# Patient Record
Sex: Female | Born: 1990 | Hispanic: Yes | Marital: Single | Smoking: Former smoker
Health system: Southern US, Community
[De-identification: ages and names within clinical notes are randomized; demographics above are authoritative.]

## PROBLEM LIST (undated history)

## (undated) ENCOUNTER — Inpatient Hospital Stay (HOSPITAL_COMMUNITY): Payer: Self-pay

## (undated) DIAGNOSIS — M797 Fibromyalgia: Secondary | ICD-10-CM

## (undated) DIAGNOSIS — J42 Unspecified chronic bronchitis: Secondary | ICD-10-CM

## (undated) DIAGNOSIS — G43909 Migraine, unspecified, not intractable, without status migrainosus: Secondary | ICD-10-CM

## (undated) DIAGNOSIS — M549 Dorsalgia, unspecified: Secondary | ICD-10-CM

## (undated) DIAGNOSIS — S022XXA Fracture of nasal bones, initial encounter for closed fracture: Secondary | ICD-10-CM

## (undated) DIAGNOSIS — IMO0002 Reserved for concepts with insufficient information to code with codable children: Secondary | ICD-10-CM

## (undated) DIAGNOSIS — F329 Major depressive disorder, single episode, unspecified: Secondary | ICD-10-CM

## (undated) DIAGNOSIS — S0232XA Fracture of orbital floor, left side, initial encounter for closed fracture: Secondary | ICD-10-CM

## (undated) DIAGNOSIS — M069 Rheumatoid arthritis, unspecified: Secondary | ICD-10-CM

## (undated) DIAGNOSIS — F32A Depression, unspecified: Secondary | ICD-10-CM

## (undated) DIAGNOSIS — J45909 Unspecified asthma, uncomplicated: Secondary | ICD-10-CM

## (undated) DIAGNOSIS — M329 Systemic lupus erythematosus, unspecified: Secondary | ICD-10-CM

## (undated) DIAGNOSIS — G8929 Other chronic pain: Secondary | ICD-10-CM

## (undated) DIAGNOSIS — F419 Anxiety disorder, unspecified: Secondary | ICD-10-CM

## (undated) HISTORY — DX: Fracture of nasal bones, initial encounter for closed fracture: S02.2XXA

## (undated) HISTORY — PX: WISDOM TOOTH EXTRACTION: SHX21

## (undated) HISTORY — DX: Fracture of orbital floor, left side, initial encounter for closed fracture: S02.32XA

---
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
## 2012-03-19 ENCOUNTER — Emergency Department (INDEPENDENT_AMBULATORY_CARE_PROVIDER_SITE_OTHER)
Admission: EM | Admit: 2012-03-19 | Discharge: 2012-03-19 | Disposition: A | Discharge status: Home / Self Care | Payer: Self-pay | Source: Home / Self Care | Attending: Emergency Medicine | Admitting: Emergency Medicine

## 2012-03-19 ENCOUNTER — Encounter (HOSPITAL_COMMUNITY): Payer: Self-pay | Admitting: Emergency Medicine

## 2012-03-19 DIAGNOSIS — R197 Diarrhea, unspecified: Secondary | ICD-10-CM

## 2012-03-19 DIAGNOSIS — R110 Nausea: Secondary | ICD-10-CM

## 2012-03-19 HISTORY — DX: Reserved for concepts with insufficient information to code with codable children: IMO0002

## 2012-03-19 HISTORY — DX: Systemic lupus erythematosus, unspecified: M32.9

## 2012-03-19 LAB — POCT PREGNANCY, URINE: Preg Test, Ur: NEGATIVE

## 2012-03-19 LAB — POCT URINALYSIS DIP (DEVICE)
Bilirubin Urine: NEGATIVE
Ketones, ur: NEGATIVE mg/dL
pH: 6 (ref 5.0–8.0)

## 2012-03-19 MED ORDER — ONDANSETRON HCL 4 MG PO TABS: 4.0000 mg | ORAL_TABLET | Freq: Three times a day (TID) | ORAL | Status: AC | PRN

## 2012-03-19 MED ORDER — ONDANSETRON 4 MG PO TBDP
4.0000 mg | ORAL_TABLET | Freq: Once | ORAL | Status: AC
  Administered 2012-03-19: 4 mg via ORAL

## 2012-03-19 MED ORDER — ONDANSETRON 4 MG PO TBDP
ORAL_TABLET | ORAL | Status: AC
  Filled 2012-03-19: qty 1

## 2012-03-19 MED ORDER — DIPHENOXYLATE-ATROPINE 2.5-0.025 MG PO TABS: 1.0000 | ORAL_TABLET | Freq: Four times a day (QID) | ORAL | Status: AC | PRN

## 2012-03-19 NOTE — ED Notes (Signed)
Reports stomach and back pain onset yesterday evening.  Reports nausea, no vomiting.  Reports diarrhea.  Reports 6-7 episodes of diarrhea.  Reports having to run to toilet right after eating.

## 2012-03-19 NOTE — ED Provider Notes (Addendum)
History     CSN: [id number]  Arrival date & time 03/19/12  1708   First MD Initiated Contact with Patient 03/19/12 1717      Chief Complaint  Patient presents with  . Abdominal Pain    (Consider location/radiation/quality/duration/timing/severity/associated sxs/prior treatment) HPI Comments: Patient presents urgent care this evening complaining of diarrheas and cramping pains since this morning. Has had about 6-7 liquid stools. Nonbloody. Denies any fevers and some stomach pains ( points to periumbilical region). No fevers or vomiting. Denies any urinary symptoms such as pressure, burning or vaginal discharge or bleeding. Patient couldn't go to work today as she was feeling bad requesting a doctor's note.  Patient is a 21 y.o. female presenting with abdominal pain. The history is provided by the patient.  Abdominal Pain The primary symptoms of the illness include abdominal pain, nausea and diarrhea. The primary symptoms of the illness do not include fever, fatigue, vomiting, hematemesis, hematochezia, dysuria or vaginal discharge. The current episode started 13 to 24 hours ago. The onset of the illness was sudden. The problem has not changed since onset. Symptoms associated with the illness do not include chills, diaphoresis or frequency.    Past Medical History  Diagnosis Date  . Pleurisy 01/07/11    complication after c-section w/infection  . Arthritis   . Lupus     Past Surgical History  Procedure Date  . Cesarean section 01/07/11    No family history on file.  History  Substance Use Topics  . Smoking status: Current Everyday Smoker  . Smokeless tobacco: Not on file  . Alcohol Use: No    OB History    Grav Para Term Preterm Abortions TAB SAB Ect Mult Living   1               Review of Systems  Constitutional: Negative for fever, chills, diaphoresis, activity change, appetite change and fatigue.  Respiratory: Negative for wheezing.   Gastrointestinal: Positive  for nausea, abdominal pain and diarrhea. Negative for vomiting, hematochezia and hematemesis.  Genitourinary: Negative for dysuria, frequency, flank pain, vaginal discharge and genital sores.  Musculoskeletal: Negative for joint swelling and arthralgias.  Neurological: Negative for dizziness.    Allergies  Latex; Penicillins; and Vicodin  Home Medications   Current Outpatient Rx  Name Route Sig Dispense Refill  . DIPHENOXYLATE-ATROPINE 2.5-0.025 MG PO TABS Oral Take 1 tablet by mouth 4 (four) times daily as needed for diarrhea or loose stools. 15 tablet 0  . ONDANSETRON HCL 4 MG PO TABS Oral Take 1 tablet (4 mg total) by mouth every 8 (eight) hours as needed for nausea. 20 tablet 0    BP 126/61  Pulse 67  Temp 98.4 F (36.9 C) (Oral)  Resp 16  SpO2 100%  Physical Exam  Nursing note and vitals reviewed. Constitutional: Vital signs are normal. She appears well-developed and well-nourished.  Non-toxic appearance. She does not have a sickly appearance. She does not appear ill. No distress.  HENT:  Head: Normocephalic.  Mouth/Throat: Uvula is midline and oropharynx is clear and moist. No oropharyngeal exudate.  Eyes: Conjunctivae are normal.  Neck: Neck supple.  Pulmonary/Chest: Effort normal and breath sounds normal.  Abdominal: Soft. She exhibits no shifting dullness and no distension. There is tenderness. There is no rigidity, no rebound, no guarding and negative Murphy's sign.    Skin: No erythema.    ED Course  Procedures (including critical care time)  Labs Reviewed  POCT URINALYSIS DIP (DEVICE) - Abnormal; Notable  for the following:    Hgb urine dipstick TRACE (*)     Leukocytes, UA SMALL (*)  Biochemical Testing Only. Please order routine urinalysis from main lab if confirmatory testing is needed.   All other components within normal limits  POCT PREGNANCY, URINE   No results found.   1. Diarrhea   2. Nausea       MDM   Patient looks comfortable mildly  nauseous. Soft with minimal periumbilical tenderness on abdominal exam. Suspect patient might be experiencing a viral gastroenteritis process. We have discussed symptomatic management and dietary changes for the next 72 hours. Have also discussed what symptoms should warrant further evaluation all specifically if vomiting fevers or right lower quadrant pain. Patient understands and agrees with treatment plan and followup care as necessary.       Jimmie Molly, MD 03/19/12 1816  Jimmie Molly, MD 03/19/12 [phone]

## 2012-05-03 ENCOUNTER — Encounter (HOSPITAL_COMMUNITY): Payer: Self-pay | Admitting: Emergency Medicine

## 2012-05-03 ENCOUNTER — Emergency Department (HOSPITAL_COMMUNITY)
Admission: EM | Admit: 2012-05-03 | Discharge: 2012-05-03 | Disposition: A | Discharge status: Home / Self Care | Payer: Self-pay | Attending: Emergency Medicine | Admitting: Emergency Medicine

## 2012-05-03 DIAGNOSIS — M329 Systemic lupus erythematosus, unspecified: Secondary | ICD-10-CM | POA: Insufficient documentation

## 2012-05-03 DIAGNOSIS — M25531 Pain in right wrist: Secondary | ICD-10-CM

## 2012-05-03 DIAGNOSIS — M25532 Pain in left wrist: Secondary | ICD-10-CM

## 2012-05-03 DIAGNOSIS — M129 Arthropathy, unspecified: Secondary | ICD-10-CM | POA: Insufficient documentation

## 2012-05-03 DIAGNOSIS — M069 Rheumatoid arthritis, unspecified: Secondary | ICD-10-CM | POA: Insufficient documentation

## 2012-05-03 DIAGNOSIS — M25539 Pain in unspecified wrist: Secondary | ICD-10-CM | POA: Insufficient documentation

## 2012-05-03 HISTORY — DX: Rheumatoid arthritis, unspecified: M06.9

## 2012-05-03 LAB — BASIC METABOLIC PANEL WITH GFR
BUN: 8 mg/dL (ref 6–23)
Calcium: 9.4 mg/dL (ref 8.4–10.5)
GFR calc non Af Amer: >90 mL/min (ref >90)
Glucose, Bld: 96 mg/dL (ref 70–99)

## 2012-05-03 LAB — BASIC METABOLIC PANEL
CO2: 27 mEq/L (ref 19–32)
Chloride: 104 mEq/L (ref 96–112)
Creatinine, Ser: 0.6 mg/dL (ref 0.50–1.10)
GFR calc Af Amer: >90 mL/min (ref >90)
Potassium: 4.1 mEq/L (ref 3.5–5.1)
Sodium: 137 mEq/L (ref 135–145)

## 2012-05-03 MED ORDER — PREDNISONE 20 MG PO TABS: 40.0000 mg | ORAL_TABLET | Freq: Every day | ORAL | Status: DC

## 2012-05-03 MED ORDER — PREDNISONE 20 MG PO TABS
40.0000 mg | ORAL_TABLET | Freq: Once | ORAL | Status: AC
  Administered 2012-05-03: 40 mg via ORAL
  Filled 2012-05-03: qty 2

## 2012-05-03 NOTE — ED Notes (Signed)
Pt c/o muscle spasms to right arm/wrist and left knuckle area. Symptoms onset Saturday. Pt reports history of lupus and arthritis but currently symptoms not the same.

## 2012-05-03 NOTE — Progress Notes (Signed)
Orthopedic Tech Progress Note Patient Details:  [name] 1991/01/19 [id number]  Ortho Devices Type of Ortho Device: Velcro wrist splint Ortho Device/Splint Location: bilateral Ortho Device/Splint Interventions: Application   Omeka Holben 05/03/2012, 2:55 PM

## 2012-05-03 NOTE — ED Provider Notes (Signed)
History  This chart was scribed for [name]. Oletta Lamas, MD by Ladona Ridgel Day. This patient was seen in room TR09C/TR09C and the patient's care was started at 1246.   CSN: [id number]  Arrival date & time 05/03/12  1246   None     Chief Complaint  Patient presents with  . Spasms  = The history is provided by the patient. No language interpreter was used.   [name] is a 21 y.o. female who presents to the Emergency Department complaining of bilateral hand/wrist pain for the past 3 days with some associated numbness/tingling of her wrists. 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 She states that any activities like driving maker her wrist/hand pain worse. She denies diet changes, emesis, or diarrhea.  She gets sharp paresthesias through hands, starting at wrists, radiates to forearms.  No recent lupus or RA flares.  Not on chronic prednisone.  Has been on it before.  No weakness to arms, legs.  No HA's.  No trauma.    Past Medical History  Diagnosis Date  . Pleurisy 01/07/11    complication after c-section w/infection  . Arthritis   . Lupus   . Rheumatoid arthritis     Past Surgical History  Procedure Date  . Cesarean section 01/07/11    History reviewed. No pertinent family history.  History  Substance Use Topics  . Smoking status: Current Every Day Smoker  . Smokeless tobacco: Not on file  . Alcohol Use: No    OB History    Grav Para Term Preterm Abortions TAB SAB Ect Mult Living   1               Review of Systems  Constitutional: Negative for fever and chills.  Respiratory: Negative for shortness of breath.   Gastrointestinal: Negative for nausea, vomiting and diarrhea.  Musculoskeletal: Positive for joint swelling and arthralgias.       Bilateral wrist/hand pain  Skin: Negative for rash and wound.  Neurological: Positive for numbness (numbness/tingling of her bilateral wrists/hands). Negative for weakness.    Allergies   Latex; Penicillins; and Vicodin  Home Medications   Current Outpatient Rx  Name Route Sig Dispense Refill  . ETONOGESTREL 68 MG [place] IMPL Subcutaneous Inject 68 mg into the skin once.    Marland Kitchen PREDNISONE 20 MG PO TABS Oral Take 2 tablets (40 mg total) by mouth daily. 14 tablet 0    Triage Vitals: BP 123/60  Pulse 62  Temp 98.4 F (36.9 C) (Oral)  Resp 18  SpO2 100%  LMP 04/19/2012  Physical Exam  Nursing note and vitals reviewed. Constitutional: She is oriented to person, place, and time. She appears well-developed and well-nourished. No distress.  HENT:  Head: Normocephalic and atraumatic.  Eyes: EOM are normal. Pupils are equal, round, and reactive to light. No scleral icterus.  Neck: Neck supple. No tracheal deviation present.  Cardiovascular: Normal rate.   Pulmonary/Chest: Effort normal. No respiratory distress.  Musculoskeletal: Normal range of motion. She exhibits tenderness.       Bilateral wrist pain/tenderness with movement. No visual deformities. No malar rash, swelling, or edema.   Neurological: She is alert and oriented to person, place, and time. She has normal strength. She exhibits normal muscle tone.  Skin: Skin is warm and dry.  Psychiatric: She has a normal mood and affect. Her behavior is normal.    ED Course  Procedures (including critical care time)  DIAGNOSTIC STUDIES: Oxygen Saturation is 100% on room air, normal by my interpretation.    COORDINATION OF CARE: At 205 PM Discussed treatment plan with patient which includes prednisone. Patient agrees.    Labs Reviewed  BASIC METABOLIC PANEL   No results found.   1. Bilateral wrist pain       MDM  I personally performed the services described in this documentation, which was scribed in my presence. The recorded information has been reviewed and considered.    History suggests overuse type pain and injury.  Not classic distribution for carpal tunnel, but possible given her line of work.  I  don't think Lupus is related, but will put on prednisone as it can act as both anti-inflammatory and can help with Lupus if any invovement.  B wrist braces provided.            [name]. Rosalin Buster, MD 05/04/12 1359

## 2013-08-09 ENCOUNTER — Emergency Department (HOSPITAL_COMMUNITY)
Admission: EM | Admit: 2013-08-09 | Discharge: 2013-08-09 | Disposition: A | Discharge status: Home / Self Care | Payer: Medicaid Other | Attending: Emergency Medicine | Admitting: Emergency Medicine

## 2013-08-09 ENCOUNTER — Encounter (HOSPITAL_COMMUNITY): Payer: Self-pay | Admitting: Emergency Medicine

## 2013-08-09 DIAGNOSIS — F17200 Nicotine dependence, unspecified, uncomplicated: Secondary | ICD-10-CM | POA: Insufficient documentation

## 2013-08-09 DIAGNOSIS — Z880 Allergy status to penicillin: Secondary | ICD-10-CM | POA: Insufficient documentation

## 2013-08-09 DIAGNOSIS — R112 Nausea with vomiting, unspecified: Secondary | ICD-10-CM | POA: Insufficient documentation

## 2013-08-09 DIAGNOSIS — N390 Urinary tract infection, site not specified: Secondary | ICD-10-CM | POA: Insufficient documentation

## 2013-08-09 DIAGNOSIS — J029 Acute pharyngitis, unspecified: Secondary | ICD-10-CM | POA: Insufficient documentation

## 2013-08-09 DIAGNOSIS — IMO0001 Reserved for inherently not codable concepts without codable children: Secondary | ICD-10-CM | POA: Insufficient documentation

## 2013-08-09 DIAGNOSIS — Z3202 Encounter for pregnancy test, result negative: Secondary | ICD-10-CM | POA: Insufficient documentation

## 2013-08-09 DIAGNOSIS — Z8739 Personal history of other diseases of the musculoskeletal system and connective tissue: Secondary | ICD-10-CM | POA: Insufficient documentation

## 2013-08-09 DIAGNOSIS — Z91040 Latex allergy status: Secondary | ICD-10-CM | POA: Insufficient documentation

## 2013-08-09 LAB — CBC WITH DIFFERENTIAL/PLATELET
Basophils Absolute: 0 10*3/uL (ref 0.0–0.1)
HCT: 37.6 % (ref 36.0–46.0)
Lymphocytes Relative: 28 % (ref 12–46)
Lymphs Abs: 1.8 10*3/uL (ref 0.7–4.0)
Monocytes Absolute: 0.6 10*3/uL (ref 0.1–1.0)
Neutro Abs: 4 10*3/uL (ref 1.7–7.7)
RBC: 4.04 MIL/uL (ref 3.87–5.11)
RDW: 13 % (ref 11.5–15.5)
WBC: 6.5 10*3/uL (ref 4.0–10.5)

## 2013-08-09 LAB — BASIC METABOLIC PANEL
CO2: 23 mEq/L (ref 19–32)
Chloride: 102 mEq/L (ref 96–112)
Glucose, Bld: 97 mg/dL (ref 70–99)
Sodium: 139 mEq/L (ref 137–147)

## 2013-08-09 LAB — URINALYSIS, ROUTINE W REFLEX MICROSCOPIC
Nitrite: POSITIVE — AB
Protein, ur: NEGATIVE mg/dL
Specific Gravity, Urine: 1.018 (ref 1.005–1.030)
Urobilinogen, UA: 1 mg/dL (ref 0.0–1.0)

## 2013-08-09 LAB — PREGNANCY, URINE: Preg Test, Ur: NEGATIVE

## 2013-08-09 LAB — URINE MICROSCOPIC-ADD ON

## 2013-08-09 LAB — INFLUENZA PANEL BY PCR (TYPE A & B)
Influenza A By PCR: NEGATIVE
Influenza B By PCR: NEGATIVE

## 2013-08-09 MED ORDER — ONDANSETRON HCL 4 MG/2ML IJ SOLN
4.0000 mg | Freq: Once | INTRAMUSCULAR | Status: AC
  Administered 2013-08-09: 4 mg via INTRAVENOUS
  Filled 2013-08-09: qty 2

## 2013-08-09 MED ORDER — TRAMADOL HCL 50 MG PO TABS: 50.0000 mg | ORAL_TABLET | Freq: Four times a day (QID) | ORAL | Status: DC | PRN

## 2013-08-09 MED ORDER — ONDANSETRON 4 MG PO TBDP: 4.0000 mg | ORAL_TABLET | Freq: Three times a day (TID) | ORAL | Status: DC | PRN

## 2013-08-09 MED ORDER — CIPROFLOXACIN IN D5W 400 MG/200ML IV SOLN
400.0000 mg | Freq: Once | INTRAVENOUS | Status: AC
  Administered 2013-08-09: 400 mg via INTRAVENOUS
  Filled 2013-08-09: qty 200

## 2013-08-09 MED ORDER — KETOROLAC TROMETHAMINE 30 MG/ML IJ SOLN
30.0000 mg | Freq: Once | INTRAMUSCULAR | Status: AC
  Administered 2013-08-09: 30 mg via INTRAVENOUS
  Filled 2013-08-09: qty 1

## 2013-08-09 MED ORDER — CIPROFLOXACIN HCL 500 MG PO TABS: 500.0000 mg | ORAL_TABLET | Freq: Two times a day (BID) | ORAL | Status: DC

## 2013-08-09 MED ORDER — SODIUM CHLORIDE 0.9 % IV BOLUS (SEPSIS)
1000.0000 mL | Freq: Once | INTRAVENOUS | Status: AC
  Administered 2013-08-09: 1000 mL via INTRAVENOUS

## 2013-08-09 NOTE — ED Provider Notes (Signed)
CSN: [id number]     Arrival date & time 08/09/13  0714 History   First MD Initiated Contact with Patient 08/09/13 [phone]     Chief Complaint  Patient presents with  . Influenza   (Consider location/radiation/quality/duration/timing/severity/associated sxs/prior Treatment) HPI Comments: Patient is a 22 year old female who presents with a 3 day history of headache, nausea, vomiting, abdominal pain, myalgias and sore throat. Symptoms started gradually and progressively worsened since the onset. Patient reports being around her 2 cousins who have both been diagnosed with influenza. Patient has not tried anything at home for her symptoms. The abdominal pain, body pain and headaches are describes as aching and moderate. No aggravating/alleviating factors.    Past Medical History  Diagnosis Date  . Pleurisy 01/07/11    complication after c-section w/infection  . Arthritis   . Lupus   . Rheumatoid arthritis(714.0)    Past Surgical History  Procedure Laterality Date  . Cesarean section  01/07/11   History reviewed. No pertinent family history. History  Substance Use Topics  . Smoking status: Current Every Day Smoker  . Smokeless tobacco: Not on file  . Alcohol Use: No   OB History   Grav Para Term Preterm Abortions TAB SAB Ect Mult Living   1              Review of Systems  Constitutional: Positive for appetite change. Negative for fever, chills and fatigue.  HENT: Positive for sore throat. Negative for trouble swallowing.   Eyes: Negative for visual disturbance.  Respiratory: Negative for shortness of breath.   Cardiovascular: Negative for chest pain and palpitations.  Gastrointestinal: Positive for abdominal pain. Negative for nausea, vomiting and diarrhea.  Genitourinary: Negative for dysuria and difficulty urinating.  Musculoskeletal: Positive for myalgias. Negative for arthralgias and neck pain.  Skin: Negative for color change.  Neurological: Positive for headaches. Negative  for dizziness and weakness.  Psychiatric/Behavioral: Negative for dysphoric mood.    Allergies  Latex; Penicillins; and Vicodin  Home Medications   Current Outpatient Rx  Name  Route  Sig  Dispense  Refill  . etonogestrel (IMPLANON) 68 MG IMPL implant   Subcutaneous   Inject 68 mg into the skin once.          BP 121/70  Pulse 118  Temp(Src) 98.2 F (36.8 C) (Oral)  Resp 20  Ht 5\' 2"  (1.575 m)  Wt 198 lb (89.812 kg)  BMI 36.21 kg/m2  SpO2 97% Physical Exam  Nursing note and vitals reviewed. Constitutional: She is oriented to person, place, and time. She appears well-developed and well-nourished. No distress.  HENT:  Head: Normocephalic and atraumatic.  Mouth/Throat: Oropharynx is clear and moist. No oropharyngeal exudate.  Eyes: Conjunctivae and EOM are normal. Pupils are equal, round, and reactive to light.  Neck: Normal range of motion.  Cardiovascular: Normal rate and regular rhythm.  Exam reveals no gallop and no friction rub.   No murmur heard. Pulmonary/Chest: Effort normal and breath sounds normal. She has no wheezes. She has no rales. She exhibits no tenderness.  Abdominal: Soft. She exhibits no distension. There is tenderness. There is no rebound and no guarding.  Generalized mild abdominal tenderness to palpation. No focal tenderness or peritoneal signs.   Musculoskeletal: Normal range of motion.  Lymphadenopathy:    She has no cervical adenopathy.  Neurological: She is alert and oriented to person, place, and time. Coordination normal.  Speech is goal-oriented. Moves limbs without ataxia.   Skin: Skin is warm  and dry.  Psychiatric: She has a normal mood and affect. Her behavior is normal.    ED Course  Procedures (including critical care time) Labs Review Labs Reviewed  BASIC METABOLIC PANEL - Abnormal; Notable for the following:    Potassium 3.6 (*)    All other components within normal limits  URINALYSIS, ROUTINE W REFLEX MICROSCOPIC - Abnormal;  Notable for the following:    APPearance CLOUDY (*)    Hgb urine dipstick SMALL (*)    Nitrite POSITIVE (*)    Leukocytes, UA MODERATE (*)    All other components within normal limits  URINE MICROSCOPIC-ADD ON - Abnormal; Notable for the following:    Squamous Epithelial / LPF MANY (*)    Bacteria, UA MANY (*)    All other components within normal limits  URINE CULTURE  CBC WITH DIFFERENTIAL  INFLUENZA PANEL BY PCR  PREGNANCY, URINE   Imaging Review No results found.  EKG Interpretation   None       MDM   1. UTI (urinary tract infection)     9:40 AM Labs unremarkable for acute changes. Urinalysis shows UTI. Patient is tachycardic with other remaining vitals stable. Patient is afebrile. Patient will have IV Cipro here for UTI. Flu test pending. Patient will have IV fluids for tachycardia.   12:20 PM Flu test negative. Patient feeling better after receiving fluids and IV cipro. Patient will be discharged with Cipro for UTI, tramadol for pain and zofran for nausea. Patient instructed to follow up with PCP as needed. Patient instructed to return with worsening or concerning symptoms.     Emilia Beck, PA-C 08/09/13 1630

## 2013-08-09 NOTE — ED Notes (Signed)
Pt knows that urine is needed 

## 2013-08-09 NOTE — ED Notes (Signed)
Pt reports n/v since Monday. Having headache, sore throat, body pain, sore throat. Mask on pt at triage.

## 2013-08-09 NOTE — ED Notes (Signed)
Pt discharged home with all belongings, alert and ambulatory upon discharge, 3 new RX prescribed, pt verbalizes understanding of discharge instructions, pt drove self home no narcotics given in ED

## 2013-08-09 NOTE — ED Notes (Signed)
Pt c/o all over body aches, fever, chills, sweating, nauseated, fatigue and loss of appetite x2 days. Pt reports her sister just diagnosed with the flu and was experiencing similar symptoms. Pt also c/o posterior headache. Pt states she has not had any desire to eat but is able to keep down vitamin fluid

## 2013-08-10 NOTE — ED Provider Notes (Signed)
Medical screening examination/treatment/procedure(s) were performed by non-physician practitioner and as supervising physician I was immediately available for consultation/collaboration.     Veryl Speak, MD 08/10/13 [phone]

## 2013-08-11 LAB — URINE CULTURE
Colony Count: >=100000
Special Requests: NORMAL

## 2013-08-12 ENCOUNTER — Telehealth (HOSPITAL_COMMUNITY): Payer: Self-pay | Admitting: Emergency Medicine

## 2013-08-12 NOTE — ED Notes (Signed)
Post ED Visit - Positive Culture Follow-up  Culture report reviewed by antimicrobial stewardship pharmacist: [x]  Wes Maunie, Pharm.D., BCPS []  Heide Guile, Pharm.D., BCPS []  Alycia Rossetti, Pharm.D., BCPS []  [place], Pharm.D., BCPS, AAHIVP []  Legrand Como, Pharm.D., BCPS, AAHIVP  Positive urine culture Treated with Cipro, organism sensitive to the same and no further patient follow-up is required at this time.  [place], Rex Kras 08/12/2013, 5:22 PM

## 2013-09-03 ENCOUNTER — Encounter (HOSPITAL_COMMUNITY): Payer: Self-pay | Admitting: Emergency Medicine

## 2013-09-03 ENCOUNTER — Emergency Department (HOSPITAL_COMMUNITY)
Admission: EM | Admit: 2013-09-03 | Discharge: 2013-09-03 | Disposition: A | Discharge status: Home / Self Care | Payer: Medicaid Other | Attending: Emergency Medicine | Admitting: Emergency Medicine

## 2013-09-03 DIAGNOSIS — K0889 Other specified disorders of teeth and supporting structures: Secondary | ICD-10-CM

## 2013-09-03 DIAGNOSIS — H9209 Otalgia, unspecified ear: Secondary | ICD-10-CM | POA: Insufficient documentation

## 2013-09-03 DIAGNOSIS — M329 Systemic lupus erythematosus, unspecified: Secondary | ICD-10-CM | POA: Insufficient documentation

## 2013-09-03 DIAGNOSIS — R05 Cough: Secondary | ICD-10-CM | POA: Insufficient documentation

## 2013-09-03 DIAGNOSIS — M129 Arthropathy, unspecified: Secondary | ICD-10-CM | POA: Insufficient documentation

## 2013-09-03 DIAGNOSIS — Z91040 Latex allergy status: Secondary | ICD-10-CM | POA: Insufficient documentation

## 2013-09-03 DIAGNOSIS — R059 Cough, unspecified: Secondary | ICD-10-CM | POA: Insufficient documentation

## 2013-09-03 DIAGNOSIS — Z79899 Other long term (current) drug therapy: Secondary | ICD-10-CM | POA: Insufficient documentation

## 2013-09-03 DIAGNOSIS — Z880 Allergy status to penicillin: Secondary | ICD-10-CM | POA: Insufficient documentation

## 2013-09-03 DIAGNOSIS — K006 Disturbances in tooth eruption: Secondary | ICD-10-CM | POA: Insufficient documentation

## 2013-09-03 DIAGNOSIS — M542 Cervicalgia: Secondary | ICD-10-CM | POA: Insufficient documentation

## 2013-09-03 DIAGNOSIS — K089 Disorder of teeth and supporting structures, unspecified: Secondary | ICD-10-CM | POA: Insufficient documentation

## 2013-09-03 DIAGNOSIS — M069 Rheumatoid arthritis, unspecified: Secondary | ICD-10-CM | POA: Insufficient documentation

## 2013-09-03 DIAGNOSIS — F17200 Nicotine dependence, unspecified, uncomplicated: Secondary | ICD-10-CM | POA: Insufficient documentation

## 2013-09-03 MED ORDER — OXYCODONE-ACETAMINOPHEN 5-325 MG PO TABS
2.0000 | ORAL_TABLET | Freq: Once | ORAL | Status: AC
  Administered 2013-09-03: 2 via ORAL
  Filled 2013-09-03: qty 2

## 2013-09-03 MED ORDER — OXYCODONE-ACETAMINOPHEN 5-325 MG PO TABS: 1.0000 | ORAL_TABLET | Freq: Four times a day (QID) | ORAL | Status: DC | PRN

## 2013-09-03 MED ORDER — CLINDAMYCIN HCL 300 MG PO CAPS: 300.0000 mg | ORAL_CAPSULE | Freq: Four times a day (QID) | ORAL | Status: DC

## 2013-09-03 NOTE — ED Notes (Signed)
Pt arrived to the Ed with a complaint of dental pain.  Pt states pain is located on the right hand side both upper and lower.  Pt states this pain has been going on for about two and a half months.  Pt states it is different today as the pain is shooting down the right neck to her shoulder.

## 2013-09-03 NOTE — Discharge Instructions (Signed)
Please followup with an oral surgeon or dentist for continued evaluation and treatment.    Dental Pain A tooth ache may be caused by cavities (tooth decay). Cavities expose the nerve of the tooth to air and hot or cold temperatures. It may come from an infection or abscess (also called a boil or furuncle) around your tooth. It is also often caused by dental caries (tooth decay). This causes the pain you are having. DIAGNOSIS  Your caregiver can diagnose this problem by exam. TREATMENT   If caused by an infection, it may be treated with medications which kill germs (antibiotics) and pain medications as prescribed by your caregiver. Take medications as directed.  Only take over-the-counter or prescription medicines for pain, discomfort, or fever as directed by your caregiver.  Whether the tooth ache today is caused by infection or dental disease, you should see your dentist as soon as possible for further care. SEEK MEDICAL CARE IF: The exam and treatment you received today has been provided on an emergency basis only. This is not a substitute for complete medical or dental care. If your problem worsens or new problems (symptoms) appear, and you are unable to meet with your dentist, call or return to this location. SEEK IMMEDIATE MEDICAL CARE IF:   You have a fever.  You develop redness and swelling of your face, jaw, or neck.  You are unable to open your mouth.  You have severe pain uncontrolled by pain medicine. MAKE SURE YOU:   Understand these instructions.  Will watch your condition.  Will get help right away if you are not doing well or get worse. Document Released: 07/27/2005 Document Revised: 10/19/2011 Document Reviewed: 03/14/2008 [facility] Patient Information 2014 [place].

## 2013-09-03 NOTE — ED Provider Notes (Signed)
CSN: [id number]     Arrival date & time 09/03/13  2007 History  This chart was scribed for non-physician practitioner Hazel Sams, working with Blanchard Kelch, MD by Donato Schultz, ED Scribe. This patient was seen in room WA12/WA12 and the patient's care was started at 11:10 PM.    Chief Complaint  Patient presents with  . Dental Pain    Patient is a 23 y.o. female presenting with tooth pain. The history is provided by the patient. No language interpreter was used.  Dental Pain Associated symptoms: neck pain   Associated symptoms: no congestion    HPI Comments: [name] is a 23 y.o. female with a history of Lupus who presents to the Emergency Department complaining of constant right upper and lower dental pain caused by emerging wisdom teeth that started two and a half months ago after the patient had her wisdom teeth extracted.  She states that both extractions have holes in them and when air gets into the holes, the pain is aggravated.  She states that the pain has been radiating to her bilateral ears and neck today.  She denies cough, cold symptoms, or congestion as associated symptoms.  She states that she has taken Tylenol PM with no relief to her symptoms.  she states that she was taking the Tylenol every 4-6 hours and had her last dose at 2 PM today.  She states that she is no longer taking her medication to treat her Lupus and has not followed up with a rheumatologist.  The patient states that she is allergic to Penicillin, Vicodin and Latex.  She denies having a PCP or dentist.    Past Medical History  Diagnosis Date  . Pleurisy 7/89/38    complication after c-section w/infection  . Arthritis   . Lupus   . Rheumatoid arthritis(714.0)    Past Surgical History  Procedure Laterality Date  . Cesarean section  01/07/11   History reviewed. No pertinent family history. History  Substance Use Topics  . Smoking status: Current Every Day Smoker  . Smokeless tobacco: Not on  file  . Alcohol Use: No   OB History   Grav Para Term Preterm Abortions TAB SAB Ect Mult Living   1              Review of Systems  HENT: Positive for dental problem and ear pain. Negative for congestion.   Respiratory: Negative for cough.   Musculoskeletal: Positive for neck pain.  All other systems reviewed and are negative.    Allergies  Latex; Penicillins; and Vicodin  Home Medications   Current Outpatient Rx  Name  Route  Sig  Dispense  Refill  . etonogestrel (IMPLANON) 68 MG IMPL implant   Subcutaneous   Inject 68 mg into the skin once.         . traMADol (ULTRAM) 50 MG tablet   Oral   Take 1 tablet (50 mg total) by mouth every 6 (six) hours as needed.   15 tablet   0    Triage Vitals: BP 129/78  Pulse 81  Temp(Src) 99 F (37.2 C) (Oral)  Resp 20  Ht 5' (1.524 m)  Wt 182 lb (82.555 kg)  BMI 35.54 kg/m2  SpO2 99%  Physical Exam  Nursing note and vitals reviewed. Constitutional: She is oriented to person, place, and time. She appears well-developed and well-nourished. No distress.  HENT:  Head: Normocephalic and atraumatic.  Patient does have a partially impacted left wisdom  tooth. There is no pain or tenderness to this area. No irruption of the right-sided wisdom teeth noted. There is some pain irritation to percussion over the upper and lower second molars. Gums are non-erythematous, no draining, no swelling of the gums. No swelling of the tongue.  Eyes: EOM are normal.  Neck: Normal range of motion. Neck supple. No tracheal deviation present.  Cardiovascular: Normal rate.   Pulmonary/Chest: Effort normal. No respiratory distress.  Musculoskeletal: Normal range of motion.  Lymphadenopathy:    She has no cervical adenopathy.  Neurological: She is alert and oriented to person, place, and time.  Skin: Skin is warm and dry.  Psychiatric: She has a normal mood and affect. Her behavior is normal.    ED Course  Procedures   DIAGNOSTIC STUDIES: Oxygen  Saturation is 99% on room air, normal by my interpretation.    COORDINATION OF CARE: 11:14 PM- discussed a clinical suspicion of movement of the teeth.  The patient stated that she would like to have a prescription for an antibiotics.  Discussed discharging the patient with pain medication and advised the patient to follow-up with an oral surgeon.  The patient agreed to the treatment plan.    MDM   1. Pain, dental      I personally performed the services described in this documentation, which was scribed in my presence. The recorded information has been reviewed and is accurate.    Martie Lee, PA-C 09/04/13 2132

## 2013-09-05 NOTE — ED Provider Notes (Signed)
Medical screening examination/treatment/procedure(s) were performed by non-physician practitioner and as supervising physician I was immediately available for consultation/collaboration.  EKG Interpretation   None         Blanchard Kelch, MD 09/05/13 1048

## 2013-09-23 ENCOUNTER — Encounter (HOSPITAL_COMMUNITY): Payer: Self-pay | Admitting: Emergency Medicine

## 2013-09-23 ENCOUNTER — Emergency Department (HOSPITAL_COMMUNITY)
Admission: EM | Admit: 2013-09-23 | Discharge: 2013-09-24 | Disposition: A | Discharge status: Home / Self Care | Payer: Worker's Compensation | Attending: Emergency Medicine | Admitting: Emergency Medicine

## 2013-09-23 DIAGNOSIS — F17200 Nicotine dependence, unspecified, uncomplicated: Secondary | ICD-10-CM | POA: Insufficient documentation

## 2013-09-23 DIAGNOSIS — T22012A Burn of unspecified degree of left forearm, initial encounter: Secondary | ICD-10-CM

## 2013-09-23 DIAGNOSIS — X12XXXA Contact with other hot fluids, initial encounter: Secondary | ICD-10-CM | POA: Insufficient documentation

## 2013-09-23 DIAGNOSIS — X131XXA Other contact with steam and other hot vapors, initial encounter: Secondary | ICD-10-CM

## 2013-09-23 DIAGNOSIS — Y929 Unspecified place or not applicable: Secondary | ICD-10-CM | POA: Insufficient documentation

## 2013-09-23 DIAGNOSIS — Z880 Allergy status to penicillin: Secondary | ICD-10-CM | POA: Insufficient documentation

## 2013-09-23 DIAGNOSIS — T22219A Burn of second degree of unspecified forearm, initial encounter: Secondary | ICD-10-CM | POA: Insufficient documentation

## 2013-09-23 DIAGNOSIS — M069 Rheumatoid arthritis, unspecified: Secondary | ICD-10-CM | POA: Insufficient documentation

## 2013-09-23 DIAGNOSIS — Z8709 Personal history of other diseases of the respiratory system: Secondary | ICD-10-CM | POA: Insufficient documentation

## 2013-09-23 DIAGNOSIS — Z91040 Latex allergy status: Secondary | ICD-10-CM | POA: Insufficient documentation

## 2013-09-23 DIAGNOSIS — Y93E5 Activity, floor mopping and cleaning: Secondary | ICD-10-CM | POA: Insufficient documentation

## 2013-09-23 MED ORDER — ACETAMINOPHEN-CODEINE #3 300-30 MG PO TABS
1.0000 | ORAL_TABLET | Freq: Once | ORAL | Status: AC
  Administered 2013-09-24: 1 via ORAL
  Filled 2013-09-23: qty 1

## 2013-09-23 MED ORDER — SILVER SULFADIAZINE 1 % EX CREA
TOPICAL_CREAM | Freq: Once | CUTANEOUS | Status: AC
  Administered 2013-09-24: via TOPICAL
  Filled 2013-09-23: qty 85
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
## 2013-09-24 MED ORDER — ACETAMINOPHEN-CODEINE #3 300-30 MG PO TABS: 1.0000 | ORAL_TABLET | Freq: Four times a day (QID) | ORAL | Status: DC | PRN

## 2013-09-24 NOTE — ED Provider Notes (Signed)
Medical screening examination/treatment/procedure(s) were performed by non-physician practitioner and as supervising physician I was immediately available for consultation/collaboration.  EKG Interpretation   None         Blanchie Dessert, MD 09/24/13 1233

## 2013-09-24 NOTE — Discharge Instructions (Signed)
Please follow up with your primary care physician in 1-2 days. If you do not have one please call the [place] number listed above. Please take pain medication as prescribed and as needed for pain. Please do not drive on narcotic pain medication. Please read all discharge instructions and return precautions.    Burn Care Your skin is a natural barrier to infection. It is the largest organ of your body. Burns damage this natural protection. To help prevent infection, it is very important to follow your caregiver's instructions in the care of your burn. Burns are classified as:  First degree. There is only redness of the skin (erythema). No scarring is expected.  Second degree. There is blistering of the skin. Scarring may occur with deeper burns.  Third degree. All layers of the skin are injured, and scarring is expected. HOME CARE INSTRUCTIONS   Wash your hands well before changing your bandage.  Change your bandage as often as directed by your caregiver.  Remove the old bandage. If the bandage sticks, you may soak it off with cool, clean water.  Cleanse the burn thoroughly but gently with mild soap and water.  Pat the area dry with a clean, dry cloth.  Apply a thin layer of antibacterial cream to the burn.  Apply a clean bandage as instructed by your caregiver.  Keep the bandage as clean and dry as possible.  Elevate the affected area for the first 24 hours, then as instructed by your caregiver.  Only take over-the-counter or prescription medicines for pain, discomfort, or fever as directed by your caregiver. SEEK IMMEDIATE MEDICAL CARE IF:   You develop excessive pain.  You develop redness, tenderness, swelling, or red streaks near the burn.  The burned area develops yellowish-white fluid (pus) or a bad smell.  You have a fever. MAKE SURE YOU:   Understand these instructions.  Will watch your condition.  Will get help right away if you are not  doing well or get worse. Document Released: 07/27/2005 Document Revised: 10/19/2011 Document Reviewed: 12/17/2010 [facility] Patient Information 2014 [place], [place].

## 2013-09-27 ENCOUNTER — Telehealth: Payer: Self-pay | Admitting: Pediatrics
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
## 2013-09-29 ENCOUNTER — Encounter: Payer: Self-pay | Admitting: Pediatrics

## 2013-09-29 ENCOUNTER — Ambulatory Visit (INDEPENDENT_AMBULATORY_CARE_PROVIDER_SITE_OTHER): Payer: Medicaid Other | Admitting: Pediatrics

## 2013-09-29 VITALS — BP 114/84 | Wt 192.2 lb

## 2013-09-29 DIAGNOSIS — T22239A Burn of second degree of unspecified upper arm, initial encounter: Secondary | ICD-10-CM | POA: Diagnosis not present

## 2013-09-29 MED ORDER — IBUPROFEN 800 MG PO TABS: 800.0000 mg | ORAL_TABLET | Freq: Three times a day (TID) | ORAL | Status: DC | PRN

## 2013-09-29 NOTE — Patient Instructions (Signed)
Burn Care Burns hurt your skin. When your skin is hurt, it is easier to get an infection. Follow your doctor's directions to help prevent an infection. HOME CARE  Wash your hands well before you change your bandage.  Change your bandage as often as told by your doctor.  Remove the old bandage. If the bandage sticks, soak it off with cool, clean water.  Gently clean the burn with mild soap and water.  Pat the burn dry with a clean, dry cloth.  Put a thin layer of medicated cream on the burn.  Put a clean bandage on as told by your doctor.  Keep the bandage clean and dry.  Raise (elevate) the burn for the first 24 hours. After that, follow your doctor's directions.  Only take medicine as told by your doctor. GET HELP RIGHT AWAY IF:   You have too much pain.  The skin near the burn is red, tender, puffy (swollen), or has red streaks.  The burn area has yellowish white fluid (pus) or a bad smell coming from it.  You have a fever. MAKE SURE YOU:   Understand these instructions.  Will watch your condition.  Will get help right away if you are not doing well or get worse. Document Released: 05/05/2008 Document Revised: 10/19/2011 Document Reviewed: 12/17/2010 [facility] Patient Information 2014 [place], [place].  Remember to have food or milk with the IBUPROFEN and stop use if it causes stomach upset.
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
## 2013-10-03 ENCOUNTER — Institutional Professional Consult (permissible substitution): Payer: Self-pay | Admitting: Pediatrics

## 2013-10-24 ENCOUNTER — Encounter (HOSPITAL_COMMUNITY): Payer: Self-pay | Admitting: Emergency Medicine

## 2013-10-24 ENCOUNTER — Emergency Department (HOSPITAL_COMMUNITY)
Admission: EM | Admit: 2013-10-24 | Discharge: 2013-10-24 | Disposition: A | Discharge status: Home / Self Care | Payer: 59 | Attending: Emergency Medicine | Admitting: Emergency Medicine

## 2013-10-24 DIAGNOSIS — M069 Rheumatoid arthritis, unspecified: Secondary | ICD-10-CM | POA: Insufficient documentation

## 2013-10-24 DIAGNOSIS — M129 Arthropathy, unspecified: Secondary | ICD-10-CM | POA: Insufficient documentation

## 2013-10-24 DIAGNOSIS — Z91040 Latex allergy status: Secondary | ICD-10-CM | POA: Insufficient documentation

## 2013-10-24 DIAGNOSIS — Z79899 Other long term (current) drug therapy: Secondary | ICD-10-CM | POA: Insufficient documentation

## 2013-10-24 DIAGNOSIS — Z888 Allergy status to other drugs, medicaments and biological substances status: Secondary | ICD-10-CM | POA: Insufficient documentation

## 2013-10-24 DIAGNOSIS — M329 Systemic lupus erythematosus, unspecified: Secondary | ICD-10-CM | POA: Insufficient documentation

## 2013-10-24 DIAGNOSIS — K0889 Other specified disorders of teeth and supporting structures: Secondary | ICD-10-CM

## 2013-10-24 DIAGNOSIS — Z8781 Personal history of (healed) traumatic fracture: Secondary | ICD-10-CM | POA: Insufficient documentation

## 2013-10-24 DIAGNOSIS — Z880 Allergy status to penicillin: Secondary | ICD-10-CM | POA: Insufficient documentation

## 2013-10-24 DIAGNOSIS — K089 Disorder of teeth and supporting structures, unspecified: Secondary | ICD-10-CM | POA: Insufficient documentation

## 2013-10-24 MED ORDER — ACETAMINOPHEN-CODEINE #3 300-30 MG PO TABS
1.0000 | ORAL_TABLET | Freq: Once | ORAL | Status: AC
  Administered 2013-10-24: 1 via ORAL
  Filled 2013-10-24: qty 1

## 2013-10-24 MED ORDER — ACETAMINOPHEN-CODEINE #3 300-30 MG PO TABS: 1.0000 | ORAL_TABLET | Freq: Four times a day (QID) | ORAL | Status: DC | PRN

## 2013-10-24 MED ORDER — BENZOCAINE 10 % MT GEL: 1.0000 "application " | OROMUCOSAL | Status: DC | PRN

## 2013-10-24 MED ORDER — LIDOCAINE VISCOUS 2 % MT SOLN
15.0000 mL | Freq: Once | OROMUCOSAL | Status: AC
  Administered 2013-10-24: 15 mL via OROMUCOSAL
  Filled 2013-10-24: qty 15

## 2013-10-24 MED ORDER — LIDOCAINE VISCOUS 2 % MT SOLN: 15.0000 mL | Freq: Four times a day (QID) | OROMUCOSAL | Status: DC | PRN

## 2013-10-24 NOTE — ED Notes (Signed)
Patient reports that her wisdom teeth are coming in and she is having increasing pain to mouth. Pt does have noted swelling to right side of jaw. No fevers.

## 2013-10-24 NOTE — Discharge Instructions (Signed)
Please follow up with your primary care physician in 1-2 days. If you do not have one please call the [place] number listed above. Please take pain medication and/or muscle relaxants as prescribed and as needed for pain. Please do not drive on narcotic pain medication or on muscle relaxants. Please use other medications as prescribed. Please read all discharge instructions and return precautions.    Dental Pain A tooth ache may be caused by cavities (tooth decay). Cavities expose the nerve of the tooth to air and hot or cold temperatures. It may come from an infection or abscess (also called a boil or furuncle) around your tooth. It is also often caused by dental caries (tooth decay). This causes the pain you are having. DIAGNOSIS  Your caregiver can diagnose this problem by exam. TREATMENT   If caused by an infection, it may be treated with medications which kill germs (antibiotics) and pain medications as prescribed by your caregiver. Take medications as directed.  Only take over-the-counter or prescription medicines for pain, discomfort, or fever as directed by your caregiver.  Whether the tooth ache today is caused by infection or dental disease, you should see your dentist as soon as possible for further care. SEEK MEDICAL CARE IF: The exam and treatment you received today has been provided on an emergency basis only. This is not a substitute for complete medical or dental care. If your problem worsens or new problems (symptoms) appear, and you are unable to meet with your dentist, call or return to this location. SEEK IMMEDIATE MEDICAL CARE IF:   You have a fever.  You develop redness and swelling of your face, jaw, or neck.  You are unable to open your mouth.  You have severe pain uncontrolled by pain medicine. MAKE SURE YOU:   Understand these instructions.  Will watch your condition.  Will get help right away if you are not doing well or get  worse. Document Released: 07/27/2005 Document Revised: 10/19/2011 Document Reviewed: 03/14/2008 [facility] Patient Information 2014 [place].  RESOURCE GUIDE  If you do not have a primary care doctor to follow up with regarding today's visit, please call the [facility] at [phone] to make an appointment. Hours of operation are 10am - 7pm, Monday through Friday, and they have a sliding scale fee.    Dental Assistance Please contact the on-call dentist listed on your discharge papers WITHIN 48 HOURS.  If the on-call dentist agrees that your condition is emergent, there is a CHANCE (not a guarantee) that you MAY receive a savings on your visit at this point. If you wait more than 48 hours, it will not be considered an emergency.   Drs. Georges Lynch [place]:  [address] [place], [place], [address], [place]  Short-notice availability  Tooth evaluation: $100  Emergency Treatment: $200 (including exam, xrays, tooth extraction, and post-op visit)  Patients with Medicaid: [place] [phone] [address], [place] [address], [phone]  If unable to pay, or uninsured, contact [facility] [phone] in [place], [place] in [facility]) to become qualified for the adult dental clinic  Other [place]: [place], [place], [place], [address], [place], [place], 2nd and 4th Thursday of the month at 6:30am.  10 clients each day by appointment, can sometimes see walk-in patients if someone does not show for an appointment. [place]:  [address] [place], [place], [address], [facility]:  [address], [place], [place], [address], [place] Department:  [place] Department:  [place] Department:  [phone]
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
## 2013-10-24 NOTE — ED Provider Notes (Signed)
Medical screening examination/treatment/procedure(s) were performed by non-physician practitioner and as supervising physician I was immediately available for consultation/collaboration.   EKG Interpretation None        Ezequiel [place], MD 10/24/13 [phone]

## 2013-12-01 ENCOUNTER — Encounter (HOSPITAL_COMMUNITY): Payer: Self-pay | Admitting: Emergency Medicine

## 2013-12-01 ENCOUNTER — Emergency Department (HOSPITAL_COMMUNITY)
Admission: EM | Admit: 2013-12-01 | Discharge: 2013-12-01 | Disposition: A | Discharge status: Home / Self Care | Payer: 59 | Attending: Emergency Medicine | Admitting: Emergency Medicine

## 2013-12-01 ENCOUNTER — Emergency Department (HOSPITAL_COMMUNITY): Payer: 59

## 2013-12-01 DIAGNOSIS — Z91040 Latex allergy status: Secondary | ICD-10-CM | POA: Insufficient documentation

## 2013-12-01 DIAGNOSIS — N83209 Unspecified ovarian cyst, unspecified side: Secondary | ICD-10-CM | POA: Insufficient documentation

## 2013-12-01 DIAGNOSIS — Z3202 Encounter for pregnancy test, result negative: Secondary | ICD-10-CM | POA: Insufficient documentation

## 2013-12-01 DIAGNOSIS — N83201 Unspecified ovarian cyst, right side: Secondary | ICD-10-CM

## 2013-12-01 DIAGNOSIS — Z8739 Personal history of other diseases of the musculoskeletal system and connective tissue: Secondary | ICD-10-CM | POA: Insufficient documentation

## 2013-12-01 DIAGNOSIS — A599 Trichomoniasis, unspecified: Secondary | ICD-10-CM

## 2013-12-01 DIAGNOSIS — Z8781 Personal history of (healed) traumatic fracture: Secondary | ICD-10-CM | POA: Insufficient documentation

## 2013-12-01 DIAGNOSIS — R110 Nausea: Secondary | ICD-10-CM | POA: Insufficient documentation

## 2013-12-01 DIAGNOSIS — Z8709 Personal history of other diseases of the respiratory system: Secondary | ICD-10-CM | POA: Insufficient documentation

## 2013-12-01 DIAGNOSIS — Z880 Allergy status to penicillin: Secondary | ICD-10-CM | POA: Insufficient documentation

## 2013-12-01 DIAGNOSIS — A5900 Urogenital trichomoniasis, unspecified: Secondary | ICD-10-CM | POA: Insufficient documentation

## 2013-12-01 LAB — URINE MICROSCOPIC-ADD ON

## 2013-12-01 LAB — COMPREHENSIVE METABOLIC PANEL
ALT: 42 U/L — ABNORMAL HIGH (ref 0–35)
AST: 25 U/L (ref 0–37)
Albumin: 4 g/dL (ref 3.5–5.2)
Alkaline Phosphatase: 57 U/L (ref 39–117)
BUN: 6 mg/dL (ref 6–23)
CO2: 21 mEq/L (ref 19–32)
Calcium: 9.5 mg/dL (ref 8.4–10.5)
Chloride: 100 mEq/L (ref 96–112)
Creatinine, Ser: 0.63 mg/dL (ref 0.50–1.10)
GFR calc Af Amer: >90 mL/min (ref >90)
GFR calc non Af Amer: >90 mL/min (ref >90)
Glucose, Bld: 96 mg/dL (ref 70–99)
Potassium: 3.9 mEq/L (ref 3.7–5.3)
Sodium: 136 mEq/L — ABNORMAL LOW (ref 137–147)
Total Bilirubin: 0.3 mg/dL (ref 0.3–1.2)
Total Protein: 7.7 g/dL (ref 6.0–8.3)

## 2013-12-01 LAB — URINALYSIS, ROUTINE W REFLEX MICROSCOPIC
Bilirubin Urine: NEGATIVE
Glucose, UA: NEGATIVE mg/dL
Hgb urine dipstick: NEGATIVE
Ketones, ur: 40 mg/dL — AB
Nitrite: NEGATIVE
Protein, ur: NEGATIVE mg/dL
Specific Gravity, Urine: 1.014 (ref 1.005–1.030)
Urobilinogen, UA: 0.2 mg/dL (ref 0.0–1.0)
pH: 8.5 — ABNORMAL HIGH (ref 5.0–8.0)

## 2013-12-01 LAB — CBC WITH DIFFERENTIAL/PLATELET
Basophils Absolute: 0 10*3/uL (ref 0.0–0.1)
Basophils Relative: 0 % (ref 0–1)
Eosinophils Absolute: 0 10*3/uL (ref 0.0–0.7)
Eosinophils Relative: 0 % (ref 0–5)
HCT: 37.2 % (ref 36.0–46.0)
Hemoglobin: 12.6 g/dL (ref 12.0–15.0)
Lymphocytes Relative: 12 % (ref 12–46)
Lymphs Abs: 1.6 10*3/uL (ref 0.7–4.0)
MCH: 32.5 pg (ref 26.0–34.0)
MCHC: 33.9 g/dL (ref 30.0–36.0)
MCV: 95.9 fL (ref 78.0–100.0)
Monocytes Absolute: 0.8 10*3/uL (ref 0.1–1.0)
Monocytes Relative: 5 % (ref 3–12)
Neutro Abs: 11.7 10*3/uL — ABNORMAL HIGH (ref 1.7–7.7)
Neutrophils Relative %: 83 % — ABNORMAL HIGH (ref 43–77)
Platelets: 252 10*3/uL (ref 150–400)
RBC: 3.88 MIL/uL (ref 3.87–5.11)
RDW: 12.4 % (ref 11.5–15.5)
WBC: 14 10*3/uL — ABNORMAL HIGH (ref 4.0–10.5)

## 2013-12-01 LAB — WET PREP, GENITAL
CLUE CELLS WET PREP: NONE SEEN
Yeast Wet Prep HPF POC: NONE SEEN

## 2013-12-01 LAB — LIPASE, BLOOD: Lipase: 16 U/L (ref 11–59)

## 2013-12-01 LAB — POC URINE PREG, ED: Preg Test, Ur: NEGATIVE

## 2013-12-01 MED ORDER — OXYCODONE-ACETAMINOPHEN 5-325 MG PO TABS: 2.0000 | ORAL_TABLET | Freq: Four times a day (QID) | ORAL | Status: DC | PRN

## 2013-12-01 MED ORDER — SODIUM CHLORIDE 0.9 % IV BOLUS (SEPSIS)
1000.0000 mL | Freq: Once | INTRAVENOUS | Status: AC
  Administered 2013-12-01: 1000 mL via INTRAVENOUS

## 2013-12-01 MED ORDER — IOHEXOL 300 MG/ML  SOLN
100.0000 mL | Freq: Once | INTRAMUSCULAR | Status: AC | PRN
  Administered 2013-12-01: 100 mL via INTRAVENOUS

## 2013-12-01 MED ORDER — METRONIDAZOLE 500 MG PO TABS: 500.0000 mg | ORAL_TABLET | Freq: Two times a day (BID) | ORAL | Status: DC

## 2013-12-01 MED ORDER — METRONIDAZOLE 500 MG PO TABS
2000.0000 mg | ORAL_TABLET | Freq: Once | ORAL | Status: AC
  Administered 2013-12-01: 2000 mg via ORAL
  Filled 2013-12-01: qty 4

## 2013-12-01 MED ORDER — PROMETHAZINE HCL 25 MG/ML IJ SOLN
25.0000 mg | Freq: Once | INTRAMUSCULAR | Status: AC
  Administered 2013-12-01: 25 mg via INTRAVENOUS
  Filled 2013-12-01: qty 1

## 2013-12-01 MED ORDER — AZITHROMYCIN 250 MG PO TABS
1000.0000 mg | ORAL_TABLET | Freq: Once | ORAL | Status: AC
  Administered 2013-12-01: 1000 mg via ORAL
  Filled 2013-12-01: qty 4

## 2013-12-01 MED ORDER — STERILE WATER FOR INJECTION IJ SOLN
INTRAMUSCULAR | Status: AC
  Administered 2013-12-01: 0.9 mL
  Filled 2013-12-01: qty 10

## 2013-12-01 MED ORDER — IOHEXOL 300 MG/ML  SOLN
25.0000 mL | INTRAMUSCULAR | Status: AC
  Administered 2013-12-01: 25 mL via ORAL

## 2013-12-01 MED ORDER — OXYCODONE-ACETAMINOPHEN 5-325 MG PO TABS
2.0000 | ORAL_TABLET | Freq: Once | ORAL | Status: AC
  Administered 2013-12-01: 2 via ORAL
  Filled 2013-12-01: qty 2

## 2013-12-01 MED ORDER — MORPHINE SULFATE 4 MG/ML IJ SOLN
4.0000 mg | Freq: Once | INTRAMUSCULAR | Status: AC
  Administered 2013-12-01: 4 mg via INTRAVENOUS
  Filled 2013-12-01: qty 1

## 2013-12-01 MED ORDER — PROMETHAZINE HCL 25 MG PO TABS: 25.0000 mg | ORAL_TABLET | Freq: Four times a day (QID) | ORAL | Status: DC | PRN

## 2013-12-01 MED ORDER — CEFTRIAXONE SODIUM 250 MG IJ SOLR
250.0000 mg | Freq: Once | INTRAMUSCULAR | Status: AC
  Administered 2013-12-01: 250 mg via INTRAMUSCULAR
  Filled 2013-12-01: qty 250

## 2013-12-01 MED ORDER — ONDANSETRON HCL 4 MG/2ML IJ SOLN
4.0000 mg | Freq: Once | INTRAMUSCULAR | Status: AC
  Administered 2013-12-01: 4 mg via INTRAVENOUS
  Filled 2013-12-01: qty 2

## 2013-12-01 MED ORDER — DOXYCYCLINE HYCLATE 100 MG PO CAPS: 100.0000 mg | ORAL_CAPSULE | Freq: Two times a day (BID) | ORAL | Status: DC
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
## 2013-12-01 NOTE — Discharge Instructions (Signed)
Ovarian Cyst °An ovarian cyst is a fluid-filled sac that forms on an ovary. The ovaries are small organs that produce eggs in women. Various types of cysts can form on the ovaries. Most are not cancerous. Many do not cause problems, and they often go away on their own. Some may cause symptoms and require treatment. Common types of ovarian cysts include: °· Functional cysts These cysts may occur every month during the menstrual cycle. This is normal. The cysts usually go away with the next menstrual cycle if the woman does not get pregnant. Usually, there are no symptoms with a functional cyst. °· Endometrioma cysts These cysts form from the tissue that lines the uterus. They are also called "chocolate cysts" because they become filled with blood that turns brown. This type of cyst can cause pain in the lower abdomen during intercourse and with your menstrual period. °· Cystadenoma cysts This type develops from the cells on the outside of the ovary. These cysts can get very big and cause lower abdomen pain and pain with intercourse. This type of cyst can twist on itself, cut off its blood supply, and cause severe pain. It can also easily rupture and cause a lot of pain. °· Dermoid cysts This type of cyst is sometimes found in both ovaries. These cysts may contain different kinds of body tissue, such as skin, teeth, hair, or cartilage. They usually do not cause symptoms unless they get very big. °· Theca lutein cysts These cysts occur when too much of a certain hormone (human chorionic gonadotropin) is produced and overstimulates the ovaries to produce an egg. This is most common after procedures used to assist with the conception of a baby (in vitro fertilization). °CAUSES  °· Fertility drugs can cause a condition in which multiple large cysts are formed on the ovaries. This is called ovarian hyperstimulation syndrome. °· A condition called polycystic ovary syndrome can cause hormonal imbalances that can lead to  nonfunctional ovarian cysts. °SIGNS AND SYMPTOMS  °Many ovarian cysts do not cause symptoms. If symptoms are present, they may include: °· Pelvic pain or pressure. °· Pain in the lower abdomen. °· Pain during sexual intercourse. °· Increasing girth (swelling) of the abdomen. °· Abnormal menstrual periods. °· Increasing pain with menstrual periods. °· Stopping having menstrual periods without being pregnant. °DIAGNOSIS  °These cysts are commonly found during a routine or annual pelvic exam. Tests may be ordered to find out more about the cyst. These tests may include: °· Ultrasound. °· X-ray of the pelvis. °· CT scan. °· MRI. °· Blood tests. °TREATMENT  °Many ovarian cysts go away on their own without treatment. Your health care provider may want to check your cyst regularly for 2 3 months to see if it changes. For women in menopause, it is particularly important to monitor a cyst closely because of the higher rate of ovarian cancer in menopausal women. When treatment is needed, it may include any of the following: °· A procedure to drain the cyst (aspiration). This may be done using a long needle and ultrasound. It can also be done through a laparoscopic procedure. This involves using a thin, lighted tube with a tiny camera on the end (laparoscope) inserted through a small incision. °· Surgery to remove the whole cyst. This may be done using laparoscopic surgery or an open surgery involving a larger incision in the lower abdomen. °· Hormone treatment or birth control pills. These methods are sometimes used to help dissolve a cyst. °HOME CARE   INSTRUCTIONS   Only take over-the-counter or prescription medicines as directed by your health care provider.  Follow up with your health care provider as directed.  Get regular pelvic exams and Pap tests. SEEK MEDICAL CARE IF:   Your periods are late, irregular, or painful, or they stop.  Your pelvic pain or abdominal pain does not go away.  Your abdomen becomes  larger or swollen.  You have pressure on your bladder or trouble emptying your bladder completely.  You have pain during sexual intercourse.  You have feelings of fullness, pressure, or discomfort in your stomach.  You lose weight for no apparent reason.  You feel generally ill.  You become constipated.  You lose your appetite.  You develop acne.  You have an increase in body and facial hair.  You are gaining weight, without changing your exercise and eating habits.  You think you are pregnant. SEEK IMMEDIATE MEDICAL CARE IF:   You have increasing abdominal pain.  You feel sick to your stomach (nauseous), and you throw up (vomit).  You develop a fever that comes on suddenly.  You have abdominal pain during a bowel movement.  Your menstrual periods become heavier than usual. Document Released: 07/27/2005 Document Revised: 05/17/2013 Document Reviewed: 04/03/2013 [facility] Patient Information 2014 [place].  Trichomoniasis Trichomoniasis is an infection, caused by the Trichomonas organism, that affects both women and men. In women, the outer female genitalia and the vagina are affected. In men, the penis is mainly affected, but the prostate and other reproductive organs can also be involved. Trichomoniasis is a sexually transmitted disease (STD) and is most often passed to another person through sexual contact. The majority of people who get trichomoniasis do so from a sexual encounter and are also at risk for other STDs. CAUSES   Sexual intercourse with an infected partner.  It can be present in swimming pools or hot tubs. SYMPTOMS   Abnormal gray-green frothy vaginal discharge in women.  Vaginal itching and irritation in women.  Itching and irritation of the area outside the vagina in women.  Penile discharge with or without pain in males.  Inflammation of the urethra (urethritis), causing painful urination.  Bleeding after sexual intercourse. RELATED  COMPLICATIONS  Pelvic inflammatory disease.  Infection of the uterus (endometritis).  Infertility.  Tubal (ectopic) pregnancy.  It can be associated with other STDs, including gonorrhea and chlamydia, hepatitis B, and HIV. COMPLICATIONS DURING PREGNANCY  Early (premature) delivery.  Premature rupture of the membranes (PROM).  Low birth weight. DIAGNOSIS   Visualization of Trichomonas under the microscope from the vagina discharge.  Ph of the vagina greater than 4.5, tested with a test tape.  Trich Rapid Test.  Culture of the organism, but this is not usually needed.  It may be found on a Pap test.  Having a "strawberry cervix,"which means the cervix looks very red like a strawberry. TREATMENT   You may be given medication to fight the infection. Inform your caregiver if you could be or are pregnant. Some medications used to treat the infection should not be taken during pregnancy.  Over-the-counter medications or creams to decrease itching or irritation may be recommended.  Your sexual partner will need to be treated if infected. HOME CARE INSTRUCTIONS   Take all medication prescribed by your caregiver.  Take over-the-counter medication for itching or irritation as directed by your caregiver.  Do not have sexual intercourse while you have the infection.  Do not douche or wear tampons.  Discuss your  infection with your partner, as your partner may have acquired the infection from you. Or, your partner may have been the person who transmitted the infection to you.  Have your sex partner examined and treated if necessary.  Practice safe, informed, and protected sex.  See your caregiver for other STD testing. SEEK MEDICAL CARE IF:   You still have symptoms after you finish the medication.  You have an oral temperature above 102 F (38.9 C).  You develop belly (abdominal) pain.  You have pain when you urinate.  You have bleeding after sexual  intercourse.  You develop a rash.  The medication makes you sick or makes you throw up (vomit). Document Released: 01/20/2001 Document Revised: 10/19/2011 Document Reviewed: 02/15/2009 [facility] Patient Information 2014 [place], [place].

## 2013-12-01 NOTE — ED Notes (Signed)
Pt presents with mid abd pain starting 0130 this am, reports this am the pain has moved to RLQ and going through to her Right lower back. Pt reports her symptoms are associated with feeling nausea, constipated, chills and fever. Pt denies vomiting

## 2013-12-02 LAB — GC/CHLAMYDIA PROBE AMP
CT Probe RNA: NEGATIVE
GC PROBE AMP APTIMA: NEGATIVE

## 2013-12-03 NOTE — ED Provider Notes (Signed)
Medical screening examination/treatment/procedure(s) were performed by non-physician practitioner and as supervising physician I was immediately available for consultation/collaboration.   EKG Interpretation None       Xuan Mateus, MD 12/03/13 0443 

## 2014-01-12 ENCOUNTER — Ambulatory Visit (INDEPENDENT_AMBULATORY_CARE_PROVIDER_SITE_OTHER): Payer: 59 | Admitting: Pediatrics

## 2014-01-12 ENCOUNTER — Encounter: Payer: Self-pay | Admitting: Pediatrics

## 2014-01-12 VITALS — BP 102/74 | Ht 62.0 in | Wt 182.2 lb

## 2014-01-12 DIAGNOSIS — Z23 Encounter for immunization: Secondary | ICD-10-CM

## 2014-01-12 DIAGNOSIS — M2550 Pain in unspecified joint: Secondary | ICD-10-CM

## 2014-01-12 MED ORDER — IBUPROFEN 800 MG PO TABS: ORAL_TABLET | ORAL | Status: DC

## 2014-01-12 NOTE — Patient Instructions (Signed)
Lupus Lupus (also called systemic lupus erythematosus, SLE) is a disorder of the body's natural defense system (immune system). In lupus, the immune system attacks various areas of the body (autoimmune disease). CAUSES The cause is unknown. However, lupus runs in families. Certain genes can make you more likely to develop lupus. It is 10 times more common in women than in men. Lupus is also more common in African Americans and Asians. Other factors also play a role, such as viruses (Epstein-Barr virus, EBV), stress, hormones, cigarette smoke, and certain drugs. SYMPTOMS Lupus can affect many parts of the body, including the joints, skin, kidneys, lungs, heart, nervous system, and blood vessels. The signs and symptoms of lupus differ from person to person. The disease can range from mild to life-threatening. Typical features of lupus include:  Butterfly-shaped rash over the face.  Arthritis involving one or more joints.  Kidney disease.  Fever, weight loss, hair loss, fatigue.  Poor circulation in the fingers and toes (Raynaud's disease).  Chest pain when taking deep breaths. Abdominal pain may also occur.  Skin rash in areas exposed to the sun.  Sores in the mouth and nose. DIAGNOSIS Diagnosing lupus can take a long time and is often difficult. An exam and an accurate account of your symptoms and health problems is very important. Blood tests are necessary, though no single test can confirm or rule out lupus. Most people with lupus test positive for antinuclear antibodies (ANA) on a blood test. Additional blood tests, a urine test (urinalysis), and sometimes a kidney or skin tissue sample (biopsy) can help to confirm or rule out lupus. TREATMENT There is no cure for lupus. Your caregiver will develop a treatment plan based on your age, sex, health, symptoms, and lifestyle. The goals are to prevent flares, to treat them when they do occur, and to minimize organ damage and complications. How  the disease may affect each person varies widely. Most people with lupus can live normal lives, but this disorder must be carefully monitored. Treatment must be adjusted as necessary to prevent serious complications. Medicines used for treatment:  Nonsteroidal anti-inflammatory drugs (NSAIDs) decrease inflammation and can help with chest pain, joint pain, and fevers. Examples include ibuprofen and naproxen.  Antimalarial drugs were designed to treat malaria. They also treat fatigue, joint pain, skin rashes, and inflammation of the lungs in patients with lupus.  Corticosteroids are powerful hormones that rapidly suppress inflammation. The lowest dose with the highest benefit will be chosen. They can be given by cream, pills, injections, and through the vein (intravenously).  Immunosuppressive drugs block the making of immune cells. They may be used for kidney or nerve disease. HOME CARE INSTRUCTIONS  Exercise. Low-impact activities can usually help keep joints flexible without being too strenuous.  Rest after periods of exercise.  Avoid excessive sun exposure.  Follow proper nutrition and take supplements as recommended by your caregiver.  Stress management can be helpful. SEEK MEDICAL CARE IF:  You have increased fatigue.  You develop pain.  You develop a rash.  You have an oral temperature above 102 F (38.9 C).  You develop abdominal discomfort.  You develop a headache.  You experience dizziness. FOR MORE INFORMATION National Institute of Neurological Disorders and Stroke: www.ninds.nih.gov American College of Rheumatology: www.rheumatology.org National Institute of Arthritis and Musculoskeletal and Skin Diseases: www.niams.nih.gov Document Released: 07/17/2002 Document Revised: 10/19/2011 Document Reviewed: 11/07/2009 [employer] Patient Information 2014 [employer].  

## 2014-01-12 NOTE — Progress Notes (Signed)
Subjective:     Patient ID: [name], female   DOB: Jan 07, 1991, 23 y.o.   MRN: [id number]  HPI [name] is here today due to pain. She is accompanied by her boyfriend. [name] states she is having increasing problems with pain in her ankles, knees and hips. She states she also has swelling at her knees. She was seen previously by this physician and reported she has previously been diagnosed with lupus but does not receive routine care. [name] states today that she used to go to the orthopedist and required inserts for her shoes; she does not recall seeing a rheumatologist but recalls going to [facility] for studies. She has not had general medical in a while but did go to [facility] at [facility] until about age 53 years.  [name] states she is not able to work as much as she wishes due to pain. She now only works about 4 hours every other day. Additionally, she is practicing a healthier lifestyle that includes walking at the track and pain somedays interferes with this. She reports feeling depressed and wishes to receive services for this. When she was younger, she saw Dr. Claiborne Billings through [facility] for medication (Zoloft prescribed) and Lorriane Shire for counseling. She is accepting of medication but does not want to restart Zoloft.  Review of Systems  Constitutional: Positive for activity change and appetite change (has been intentionally decreasing caloric intake to lose weight). Negative for fever and unexpected weight change.  HENT: Negative for congestion.   Respiratory: Negative for cough.   Cardiovascular: Negative for chest pain.  Musculoskeletal: Positive for arthralgias.  Psychiatric/Behavioral: Positive for sleep disturbance.       Objective:   Physical Exam  Constitutional: She appears well-developed and well-nourished. No distress.  Well appearing, pleasant young lady  Cardiovascular: Normal rate and normal heart sounds.   No murmur heard. Pulmonary/Chest: Effort normal and breath sounds normal. No respiratory  distress.  Musculoskeletal: Normal range of motion. She exhibits tenderness (minimal tenderness on palpation of left popliteal fossa but no mass). She exhibits no edema.  Skin: Skin is warm and dry. No rash noted.       Assessment:     Arthralgias, lupus by history Depressed mood with history of medically treated depression  Elevated BMI; has decreased weight by 10 pounds in under 4 months with effort.    Plan:     Meds ordered this encounter  Medications  . ibuprofen (ADVIL,MOTRIN) 800 MG tablet    Sig: TAKE ONE TABLET EVERY 8 HOURS AS NEEDED FOR PAIN RELIEF; TAKE WITH FOOD OR MILK.    Dispense:  30 tablet    Refill:  3  Will reschedule in adolescent clinic (missed initial appointment during the snow days) and schedule with [facility] (unavailable to see patient while in the office today).

## 2014-01-26 ENCOUNTER — Ambulatory Visit: Payer: Self-pay | Admitting: Pediatrics

## 2014-01-26 ENCOUNTER — Encounter: Payer: Self-pay | Admitting: Clinical

## 2014-02-21 ENCOUNTER — Emergency Department (HOSPITAL_COMMUNITY)
Admission: EM | Admit: 2014-02-21 | Discharge: 2014-02-21 | Disposition: A | Discharge status: Home / Self Care | Payer: 59 | Attending: Emergency Medicine | Admitting: Emergency Medicine

## 2014-02-21 ENCOUNTER — Encounter (HOSPITAL_COMMUNITY): Payer: Self-pay | Admitting: Emergency Medicine

## 2014-02-21 ENCOUNTER — Emergency Department (HOSPITAL_COMMUNITY): Payer: 59

## 2014-02-21 DIAGNOSIS — R110 Nausea: Secondary | ICD-10-CM | POA: Diagnosis not present

## 2014-02-21 DIAGNOSIS — IMO0001 Reserved for inherently not codable concepts without codable children: Secondary | ICD-10-CM | POA: Insufficient documentation

## 2014-02-21 DIAGNOSIS — Z91040 Latex allergy status: Secondary | ICD-10-CM | POA: Insufficient documentation

## 2014-02-21 DIAGNOSIS — Z8739 Personal history of other diseases of the musculoskeletal system and connective tissue: Secondary | ICD-10-CM | POA: Insufficient documentation

## 2014-02-21 DIAGNOSIS — M791 Myalgia, unspecified site: Secondary | ICD-10-CM

## 2014-02-21 DIAGNOSIS — R05 Cough: Secondary | ICD-10-CM

## 2014-02-21 DIAGNOSIS — Z880 Allergy status to penicillin: Secondary | ICD-10-CM | POA: Diagnosis not present

## 2014-02-21 DIAGNOSIS — R059 Cough, unspecified: Secondary | ICD-10-CM | POA: Diagnosis present

## 2014-02-21 DIAGNOSIS — R509 Fever, unspecified: Secondary | ICD-10-CM | POA: Insufficient documentation

## 2014-02-21 DIAGNOSIS — J069 Acute upper respiratory infection, unspecified: Secondary | ICD-10-CM | POA: Insufficient documentation

## 2014-02-21 DIAGNOSIS — Z8781 Personal history of (healed) traumatic fracture: Secondary | ICD-10-CM | POA: Insufficient documentation

## 2014-02-21 MED ORDER — IBUPROFEN 200 MG PO TABS
600.0000 mg | ORAL_TABLET | Freq: Once | ORAL | Status: AC
  Administered 2014-02-21: 600 mg via ORAL
  Filled 2014-02-21: qty 3

## 2014-02-21 MED ORDER — ONDANSETRON 4 MG PO TBDP
8.0000 mg | ORAL_TABLET | Freq: Once | ORAL | Status: AC
  Administered 2014-02-21: 8 mg via ORAL
  Filled 2014-02-21: qty 2

## 2014-02-21 NOTE — ED Notes (Signed)
Patient transported to X-ray 

## 2014-02-21 NOTE — ED Notes (Signed)
MD at bedside. 

## 2014-02-21 NOTE — ED Notes (Addendum)
23 yo with c/o nausea and vomiting, headaches, sore neck and back since 3 days ago. Temp at home was 103 orally. Unable to keep fluids down denies diarrhea today but had small amount yesterday. Reports that her daughter has double lobe pneumonia. A/O x3 Pain 8/10.  Has taken dayquil at 0300.

## 2014-02-21 NOTE — Discharge Instructions (Signed)
Rest. Drink plenty of fluids. Take tylenol/motrin as need.  Your chest xray was read as normal, no pneumonia.  Your symptoms are most likely the result of a viral illness which should run its course and get better in the next few days.  Follow up with primary care doctor in 1 week if symptoms fail to improve/resolve.  Return to ER if worse, trouble breathing, persistent vomiting, other concern.   Upper Respiratory Infection, Adult An upper respiratory infection (URI) is also known as the common cold. It is often caused by a type of germ (virus). Colds are easily spread (contagious). You can pass it to others by kissing, coughing, sneezing, or drinking out of the same glass. Usually, you get better in 1 or 2 weeks.  HOME CARE   Only take medicine as told by your doctor.  Use a warm mist humidifier or breathe in steam from a hot shower.  Drink enough water and fluids to keep your pee (urine) clear or pale yellow.  Get plenty of rest.  Return to work when your temperature is back to normal or as told by your doctor. You may use a face mask and wash your hands to stop your cold from spreading. GET HELP RIGHT AWAY IF:   After the first few days, you feel you are getting worse.  You have questions about your medicine.  You have chills, shortness of breath, or brown or red spit (mucus).  You have yellow or brown snot (nasal discharge) or pain in the face, especially when you bend forward.  You have a fever, puffy (swollen) neck, pain when you swallow, or white spots in the back of your throat.  You have a bad headache, ear pain, sinus pain, or chest pain.  You have a high-pitched whistling sound when you breathe in and out (wheezing).  You have a lasting cough or cough up blood.  You have sore muscles or a stiff neck. MAKE SURE YOU:   Understand these instructions.  Will watch your condition.  Will get help right away if you are not doing well or get worse. Document  Released: 01/13/2008 Document Revised: 10/19/2011 Document Reviewed: 12/01/2010 [facility] Patient Information 2015 [place], [place]. This information is not intended to replace advice given to you by your health care provider. Make sure you discuss any questions you have with your health care provider.   Viral Infections A viral infection can be caused by different types of viruses.Most viral infections are not serious and resolve on their own. However, some infections may cause severe symptoms and may lead to further complications. SYMPTOMS Viruses can frequently cause:  Minor sore throat.  Aches and pains.  Headaches.  Runny nose.  Different types of rashes.  Watery eyes.  Tiredness.  Cough.  Loss of appetite.  Gastrointestinal infections, resulting in nausea, vomiting, and diarrhea. These symptoms do not respond to antibiotics because the infection is not caused by bacteria. However, you might catch a bacterial infection following the viral infection. This is sometimes called a "superinfection." Symptoms of such a bacterial infection may include:  Worsening sore throat with pus and difficulty swallowing.  Swollen neck glands.  Chills and a high or persistent fever.  Severe headache.  Tenderness over the sinuses.  Persistent overall ill feeling (malaise), muscle aches, and tiredness (fatigue).  Persistent cough.  Yellow, green, or brown mucus production with coughing. HOME CARE INSTRUCTIONS   Only take over-the-counter or prescription medicines for pain, discomfort, diarrhea, or fever as directed by  your caregiver.  Drink enough water and fluids to keep your urine clear or pale yellow. Sports drinks can provide valuable electrolytes, sugars, and hydration.  Get plenty of rest and maintain proper nutrition. Soups and broths with crackers or rice are fine. SEEK IMMEDIATE MEDICAL CARE IF:   You have severe headaches, shortness of breath, chest pain, neck pain, or  an unusual rash.  You have uncontrolled vomiting, diarrhea, or you are unable to keep down fluids.  You or your child has an oral temperature above 102 F (38.9 C), not controlled by medicine.  Your baby is older than 3 months with a rectal temperature of 102 F (38.9 C) or higher.  Your baby is 12 months old or younger with a rectal temperature of 100.4 F (38 C) or higher. MAKE SURE YOU:   Understand these instructions.  Will watch your condition.  Will get help right away if you are not doing well or get worse. Document Released: 05/06/2005 Document Revised: 10/19/2011 Document Reviewed: 12/01/2010 [facility] Patient Information 2015 [place], [place]. This information is not intended to replace advice given to you by your health care provider. Make sure you discuss any questions you have with your health care provider.

## 2014-02-21 NOTE — ED Notes (Signed)
VSS. NAD noted. Pt given discharge instructions. All questions answered. Pt ambulatory on discharge.
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
## 2014-05-15 ENCOUNTER — Emergency Department (HOSPITAL_COMMUNITY): Payer: 59

## 2014-05-15 ENCOUNTER — Emergency Department (HOSPITAL_COMMUNITY)
Admission: EM | Admit: 2014-05-15 | Discharge: 2014-05-15 | Disposition: A | Discharge status: Home / Self Care | Payer: 59 | Attending: Emergency Medicine | Admitting: Emergency Medicine

## 2014-05-15 ENCOUNTER — Encounter (HOSPITAL_COMMUNITY): Payer: Self-pay | Admitting: Emergency Medicine

## 2014-05-15 DIAGNOSIS — J0100 Acute maxillary sinusitis, unspecified: Secondary | ICD-10-CM

## 2014-05-15 DIAGNOSIS — Z91040 Latex allergy status: Secondary | ICD-10-CM | POA: Diagnosis not present

## 2014-05-15 DIAGNOSIS — M791 Myalgia: Secondary | ICD-10-CM | POA: Diagnosis not present

## 2014-05-15 DIAGNOSIS — J029 Acute pharyngitis, unspecified: Secondary | ICD-10-CM | POA: Diagnosis present

## 2014-05-15 DIAGNOSIS — Z880 Allergy status to penicillin: Secondary | ICD-10-CM | POA: Diagnosis not present

## 2014-05-15 DIAGNOSIS — Z8739 Personal history of other diseases of the musculoskeletal system and connective tissue: Secondary | ICD-10-CM | POA: Diagnosis not present

## 2014-05-15 DIAGNOSIS — Z3202 Encounter for pregnancy test, result negative: Secondary | ICD-10-CM | POA: Insufficient documentation

## 2014-05-15 DIAGNOSIS — Z8781 Personal history of (healed) traumatic fracture: Secondary | ICD-10-CM | POA: Diagnosis not present

## 2014-05-15 LAB — PREGNANCY, URINE: PREG TEST UR: NEGATIVE

## 2014-05-15 LAB — RAPID STREP SCREEN (MED CTR MEBANE ONLY): STREPTOCOCCUS, GROUP A SCREEN (DIRECT): NEGATIVE

## 2014-05-15 MED ORDER — DOXYCYCLINE HYCLATE 100 MG PO CAPS: 100.0000 mg | ORAL_CAPSULE | Freq: Two times a day (BID) | ORAL | Status: DC

## 2014-05-15 MED ORDER — IBUPROFEN 400 MG PO TABS
400.0000 mg | ORAL_TABLET | Freq: Once | ORAL | Status: AC
  Administered 2014-05-15: 400 mg via ORAL
  Filled 2014-05-15: qty 1

## 2014-05-15 MED ORDER — ALBUTEROL SULFATE (2.5 MG/3ML) 0.083% IN NEBU
5.0000 mg | INHALATION_SOLUTION | Freq: Once | RESPIRATORY_TRACT | Status: AC
  Administered 2014-05-15: 5 mg via RESPIRATORY_TRACT
  Filled 2014-05-15: qty 6

## 2014-05-15 NOTE — ED Notes (Addendum)
Pt. Reports cough, sore throat, and head "fullness" x3 days with nausea.Pt. Also c/o rib pain "only when I cough, my ribs hurt".
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
## 2014-05-15 NOTE — Discharge Instructions (Signed)
Please follow the directions provided.  Be sure to follow up with your primary care provider to ensure you are getting better.  Take your antibiotic until it is all gone. Don't hesitate to return for new or worsening symptoms.    SEEK IMMEDIATE MEDICAL CARE IF:  You have increasing pain or severe headaches.  You have nausea, vomiting, or drowsiness.  You have swelling around your face.  You have vision problems.  You have a stiff neck.  You have difficulty breathing.

## 2014-05-15 NOTE — ED Notes (Signed)
Cough, sore throat, body aches, fever x 3 days. Pt's voice is hoarse at this time.

## 2014-05-17 LAB — CULTURE, GROUP A STREP

## 2014-05-20 NOTE — ED Provider Notes (Signed)
Medical screening examination/treatment/procedure(s) were performed by non-physician practitioner and as supervising physician I was immediately available for consultation/collaboration.  Orpah Greek, MD 05/20/14 [phone]

## 2014-05-29 ENCOUNTER — Encounter (HOSPITAL_COMMUNITY): Payer: Self-pay | Admitting: Emergency Medicine

## 2014-05-29 DIAGNOSIS — Z8781 Personal history of (healed) traumatic fracture: Secondary | ICD-10-CM | POA: Diagnosis not present

## 2014-05-29 DIAGNOSIS — Z8739 Personal history of other diseases of the musculoskeletal system and connective tissue: Secondary | ICD-10-CM | POA: Diagnosis not present

## 2014-05-29 DIAGNOSIS — Z79899 Other long term (current) drug therapy: Secondary | ICD-10-CM | POA: Diagnosis not present

## 2014-05-29 DIAGNOSIS — Z880 Allergy status to penicillin: Secondary | ICD-10-CM | POA: Diagnosis not present

## 2014-05-29 DIAGNOSIS — Z8709 Personal history of other diseases of the respiratory system: Secondary | ICD-10-CM | POA: Insufficient documentation

## 2014-05-29 DIAGNOSIS — H5710 Ocular pain, unspecified eye: Secondary | ICD-10-CM | POA: Insufficient documentation

## 2014-05-29 DIAGNOSIS — Z91040 Latex allergy status: Secondary | ICD-10-CM | POA: Diagnosis not present

## 2014-05-29 DIAGNOSIS — Z8679 Personal history of other diseases of the circulatory system: Secondary | ICD-10-CM | POA: Insufficient documentation

## 2014-05-29 DIAGNOSIS — R112 Nausea with vomiting, unspecified: Secondary | ICD-10-CM | POA: Insufficient documentation

## 2014-05-29 DIAGNOSIS — R51 Headache: Secondary | ICD-10-CM | POA: Diagnosis present

## 2014-05-29 DIAGNOSIS — R0981 Nasal congestion: Secondary | ICD-10-CM | POA: Insufficient documentation

## 2014-05-29 NOTE — ED Notes (Signed)
Pt presents with a headache onset this morning, admits to hx of migraines.  Pt has taken OTC medication at home without relief- admits to sensitivity to light and sound.  Pt alert and oriented X 4 at present.

## 2014-05-30 ENCOUNTER — Emergency Department (HOSPITAL_COMMUNITY)
Admission: EM | Admit: 2014-05-30 | Discharge: 2014-05-30 | Disposition: A | Discharge status: Home / Self Care | Payer: Medicaid Other | Attending: Emergency Medicine | Admitting: Emergency Medicine

## 2014-05-30 DIAGNOSIS — R51 Headache: Secondary | ICD-10-CM

## 2014-05-30 DIAGNOSIS — R519 Headache, unspecified: Secondary | ICD-10-CM

## 2014-05-30 HISTORY — DX: Migraine, unspecified, not intractable, without status migrainosus: G43.909

## 2014-05-30 MED ORDER — KETOROLAC TROMETHAMINE 60 MG/2ML IM SOLN
60.0000 mg | Freq: Once | INTRAMUSCULAR | Status: AC
  Administered 2014-05-30: 30 mg via INTRAMUSCULAR

## 2014-05-30 MED ORDER — LORATADINE 10 MG PO TABS: 10.0000 mg | ORAL_TABLET | Freq: Every day | ORAL | Status: DC

## 2014-05-30 MED ORDER — MOMETASONE FUROATE 50 MCG/ACT NA SUSP: 2.0000 | Freq: Every day | NASAL | Status: DC

## 2014-05-30 MED ORDER — DIPHENHYDRAMINE HCL 50 MG/ML IJ SOLN
INTRAMUSCULAR | Status: AC
  Administered 2014-05-30: 25 mg via INTRAVENOUS
  Filled 2014-05-30: qty 1

## 2014-05-30 MED ORDER — METOCLOPRAMIDE HCL 10 MG PO TABS: 10.0000 mg | ORAL_TABLET | Freq: Four times a day (QID) | ORAL | Status: DC | PRN

## 2014-05-30 MED ORDER — LORATADINE 10 MG PO TABS
10.0000 mg | ORAL_TABLET | Freq: Once | ORAL | Status: AC
  Administered 2014-05-30: 10 mg via ORAL
  Filled 2014-05-30: qty 1

## 2014-05-30 MED ORDER — IBUPROFEN 600 MG PO TABS: 600.0000 mg | ORAL_TABLET | Freq: Four times a day (QID) | ORAL | Status: DC | PRN

## 2014-05-30 MED ORDER — METOCLOPRAMIDE HCL 10 MG PO TABS: 10.0000 mg | ORAL_TABLET | Freq: Once | ORAL | Status: DC

## 2014-05-30 MED ORDER — METOCLOPRAMIDE HCL 5 MG/ML IJ SOLN
10.0000 mg | Freq: Once | INTRAMUSCULAR | Status: AC
  Administered 2014-05-30: 10 mg via INTRAVENOUS
  Filled 2014-05-30: qty 2

## 2014-05-30 MED ORDER — OXYMETAZOLINE HCL 0.05 % NA SOLN: 1.0000 | Freq: Two times a day (BID) | NASAL | Status: DC

## 2014-05-30 MED ORDER — OXYMETAZOLINE HCL 0.05 % NA SOLN
1.0000 | Freq: Once | NASAL | Status: AC
  Administered 2014-05-30: 1 via NASAL
  Filled 2014-05-30: qty 15

## 2014-05-30 MED ORDER — KETOROLAC TROMETHAMINE 60 MG/2ML IM SOLN
60.0000 mg | Freq: Once | INTRAMUSCULAR | Status: DC
  Filled 2014-05-30: qty 2

## 2014-05-30 MED ORDER — DIPHENHYDRAMINE HCL 50 MG/ML IJ SOLN
25.0000 mg | Freq: Once | INTRAMUSCULAR | Status: AC
  Administered 2014-05-30: 25 mg via INTRAVENOUS
  Filled 2014-05-30: qty 1

## 2014-05-30 MED ORDER — DIPHENHYDRAMINE HCL 25 MG PO CAPS
25.0000 mg | ORAL_CAPSULE | Freq: Once | ORAL | Status: DC
  Filled 2014-05-30: qty 1

## 2014-05-30 NOTE — ED Notes (Signed)
Discharge instructions given along with prescriptions  Voiced understanding.
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
## 2014-05-30 NOTE — Discharge Instructions (Signed)
Sinus Headache °A sinus headache is when your sinuses become clogged or swollen. Sinus headaches can range from mild to severe.  °CAUSES °A sinus headache can have different causes, such as: °· Colds. °· Sinus infections. °· Allergies. °SYMPTOMS  °Symptoms of a sinus headache may vary and can include: °· Headache. °· Pain or pressure in the face. °· Congested or runny nose. °· Fever. °· Inability to smell. °· Pain in upper teeth. °Weather changes can make symptoms worse. °TREATMENT  °The treatment of a sinus headache depends on the cause. °· Sinus pain caused by a sinus infection may be treated with antibiotic medicine. °· Sinus pain caused by allergies may be helped by allergy medicines (antihistamines) and medicated nasal sprays. °· Sinus pain caused by congestion may be helped by flushing the nose and sinuses with saline solution. °HOME CARE INSTRUCTIONS  °· If antibiotics are prescribed, take them as directed. Finish them even if you start to feel better. °· Only take over-the-counter or prescription medicines for pain, discomfort, or fever as directed by your caregiver. °· If you have congestion, use a nasal spray to help reduce pressure. °SEEK IMMEDIATE MEDICAL CARE IF: °· You have a fever. °· You have headaches more than once a week. °· You have sensitivity to light or sound. °· You have repeated nausea and vomiting. °· You have vision problems. °· You have sudden, severe pain in your face or head. °· You have a seizure. °· You are confused. °· Your sinus headaches do not get better after treatment. Many people think they have a sinus headache when they actually have migraines or tension headaches. °MAKE SURE YOU:  °· Understand these instructions. °· Will watch your condition. °· Will get help right away if you are not doing well or get worse. °Document Released: 09/03/2004 Document Revised: 10/19/2011 Document Reviewed: 10/25/2010 °[employer]® Patient Information ©2015 [employer]. This information is not  intended to replace advice given to you by your health care provider. Make sure you discuss any questions you have with your health care provider. ° °

## 2014-06-11 ENCOUNTER — Encounter (HOSPITAL_COMMUNITY): Payer: Self-pay | Admitting: Emergency Medicine

## 2014-07-15 ENCOUNTER — Encounter (HOSPITAL_COMMUNITY): Payer: Self-pay | Admitting: *Deleted

## 2014-07-15 ENCOUNTER — Emergency Department (HOSPITAL_COMMUNITY)
Admission: EM | Admit: 2014-07-15 | Discharge: 2014-07-15 | Disposition: A | Discharge status: Home / Self Care | Payer: 59 | Attending: Emergency Medicine | Admitting: Emergency Medicine

## 2014-07-15 DIAGNOSIS — Z87828 Personal history of other (healed) physical injury and trauma: Secondary | ICD-10-CM | POA: Insufficient documentation

## 2014-07-15 DIAGNOSIS — Z8781 Personal history of (healed) traumatic fracture: Secondary | ICD-10-CM | POA: Insufficient documentation

## 2014-07-15 DIAGNOSIS — R519 Headache, unspecified: Secondary | ICD-10-CM

## 2014-07-15 DIAGNOSIS — J0100 Acute maxillary sinusitis, unspecified: Secondary | ICD-10-CM | POA: Diagnosis not present

## 2014-07-15 DIAGNOSIS — Z79899 Other long term (current) drug therapy: Secondary | ICD-10-CM | POA: Diagnosis not present

## 2014-07-15 DIAGNOSIS — Z91040 Latex allergy status: Secondary | ICD-10-CM | POA: Insufficient documentation

## 2014-07-15 DIAGNOSIS — R51 Headache: Secondary | ICD-10-CM | POA: Insufficient documentation

## 2014-07-15 DIAGNOSIS — K5900 Constipation, unspecified: Secondary | ICD-10-CM | POA: Insufficient documentation

## 2014-07-15 DIAGNOSIS — Z8679 Personal history of other diseases of the circulatory system: Secondary | ICD-10-CM | POA: Insufficient documentation

## 2014-07-15 DIAGNOSIS — Z880 Allergy status to penicillin: Secondary | ICD-10-CM | POA: Insufficient documentation

## 2014-07-15 DIAGNOSIS — M069 Rheumatoid arthritis, unspecified: Secondary | ICD-10-CM | POA: Insufficient documentation

## 2014-07-15 MED ORDER — TRAMADOL HCL 50 MG PO TABS: 50.0000 mg | ORAL_TABLET | Freq: Four times a day (QID) | ORAL | Status: DC | PRN

## 2014-07-15 MED ORDER — LEVOFLOXACIN 750 MG PO TABS: 750.0000 mg | ORAL_TABLET | Freq: Every day | ORAL | Status: DC

## 2014-07-15 MED ORDER — PROMETHAZINE HCL 25 MG PO TABS: 25.0000 mg | ORAL_TABLET | Freq: Four times a day (QID) | ORAL | Status: DC | PRN

## 2014-07-15 MED ORDER — ONDANSETRON 4 MG PO TBDP: 4.0000 mg | ORAL_TABLET | Freq: Three times a day (TID) | ORAL | Status: DC | PRN

## 2014-07-15 MED ORDER — KETOROLAC TROMETHAMINE 60 MG/2ML IM SOLN
60.0000 mg | Freq: Once | INTRAMUSCULAR | Status: AC
  Administered 2014-07-15: 60 mg via INTRAMUSCULAR
  Filled 2014-07-15: qty 2

## 2014-07-15 MED ORDER — HYDROMORPHONE HCL 1 MG/ML IJ SOLN
0.5000 mg | Freq: Once | INTRAMUSCULAR | Status: AC
  Administered 2014-07-15: 0.5 mg via INTRAMUSCULAR
  Filled 2014-07-15: qty 1

## 2014-07-15 MED ORDER — LEVOFLOXACIN 750 MG PO TABS
750.0000 mg | ORAL_TABLET | Freq: Once | ORAL | Status: AC
  Administered 2014-07-15: 750 mg via ORAL
  Filled 2014-07-15: qty 1

## 2014-07-15 NOTE — ED Notes (Signed)
Pt ambulating independently w/ steady gait on d/c in no acute distress, A&Ox4. D/c instructions reviewed w/ pt and family - pt and family deny any further questions or concerns at present. Rx given x4  

## 2014-07-15 NOTE — Discharge Instructions (Signed)
Take a laxative twice a daily until you have daily soft stools  Levaquin once daily for 7 days for sinus infection  Naprosyn twice daily for headaches (aleve).  Tramadol for severe pain, zofran or phenergan for nausea.  Please call your doctor for a followup appointment within 24-48 hours. When you talk to your doctor please let them know that you were seen in the emergency department and have them acquire all of your records so that they can discuss the findings with you and formulate a treatment plan to fully care for your new and ongoing problems.

## 2014-07-15 NOTE — ED Notes (Signed)
Pt in c/o cough and congestion, body aches, constipation, and headache for the last three days, denies fever, no distress noted
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
## 2014-09-08 ENCOUNTER — Encounter (HOSPITAL_COMMUNITY): Payer: Self-pay | Admitting: *Deleted

## 2014-09-08 DIAGNOSIS — M3210 Systemic lupus erythematosus, organ or system involvement unspecified: Secondary | ICD-10-CM | POA: Insufficient documentation

## 2014-09-08 DIAGNOSIS — Z79899 Other long term (current) drug therapy: Secondary | ICD-10-CM | POA: Diagnosis not present

## 2014-09-08 DIAGNOSIS — Z3202 Encounter for pregnancy test, result negative: Secondary | ICD-10-CM | POA: Insufficient documentation

## 2014-09-08 DIAGNOSIS — Z8781 Personal history of (healed) traumatic fracture: Secondary | ICD-10-CM | POA: Insufficient documentation

## 2014-09-08 DIAGNOSIS — Z793 Long term (current) use of hormonal contraceptives: Secondary | ICD-10-CM | POA: Diagnosis not present

## 2014-09-08 DIAGNOSIS — Z880 Allergy status to penicillin: Secondary | ICD-10-CM | POA: Insufficient documentation

## 2014-09-08 DIAGNOSIS — R42 Dizziness and giddiness: Secondary | ICD-10-CM | POA: Diagnosis not present

## 2014-09-08 DIAGNOSIS — M791 Myalgia: Secondary | ICD-10-CM | POA: Diagnosis not present

## 2014-09-08 DIAGNOSIS — H539 Unspecified visual disturbance: Secondary | ICD-10-CM | POA: Diagnosis not present

## 2014-09-08 DIAGNOSIS — Z8709 Personal history of other diseases of the respiratory system: Secondary | ICD-10-CM | POA: Insufficient documentation

## 2014-09-08 DIAGNOSIS — R630 Anorexia: Secondary | ICD-10-CM | POA: Diagnosis not present

## 2014-09-08 DIAGNOSIS — R200 Anesthesia of skin: Secondary | ICD-10-CM | POA: Diagnosis not present

## 2014-09-08 DIAGNOSIS — Z91040 Latex allergy status: Secondary | ICD-10-CM | POA: Diagnosis not present

## 2014-09-08 DIAGNOSIS — R05 Cough: Secondary | ICD-10-CM | POA: Diagnosis present

## 2014-09-08 DIAGNOSIS — M069 Rheumatoid arthritis, unspecified: Secondary | ICD-10-CM | POA: Insufficient documentation

## 2014-09-08 DIAGNOSIS — R110 Nausea: Secondary | ICD-10-CM | POA: Diagnosis not present

## 2014-09-08 LAB — CBC WITH DIFFERENTIAL/PLATELET
BASOS PCT: 0 % (ref 0–1)
Basophils Absolute: 0 10*3/uL (ref 0.0–0.1)
EOS ABS: 0.2 10*3/uL (ref 0.0–0.7)
Eosinophils Relative: 1 % (ref 0–5)
HEMATOCRIT: 37.4 % (ref 36.0–46.0)
Hemoglobin: 12.5 g/dL (ref 12.0–15.0)
LYMPHS ABS: 4.8 10*3/uL — AB (ref 0.7–4.0)
LYMPHS PCT: 40 % (ref 12–46)
MCH: 31.1 pg (ref 26.0–34.0)
MCHC: 33.4 g/dL (ref 30.0–36.0)
MCV: 93 fL (ref 78.0–100.0)
Monocytes Absolute: 0.7 10*3/uL (ref 0.1–1.0)
Monocytes Relative: 6 % (ref 3–12)
Neutro Abs: 6.4 10*3/uL (ref 1.7–7.7)
Neutrophils Relative %: 53 % (ref 43–77)
PLATELETS: 302 10*3/uL (ref 150–400)
RBC: 4.02 MIL/uL (ref 3.87–5.11)
RDW: 13.2 % (ref 11.5–15.5)
WBC: 12.1 10*3/uL — AB (ref 4.0–10.5)

## 2014-09-08 NOTE — ED Notes (Signed)
The pt reports that she is having a lupus flare-up for 4-5 dayd.  She is c/o dizziness blurred vision nausea vomiting none now. All her boines ache.  She does not take the meds she needs.  lmp now and she has  An implant

## 2014-09-09 ENCOUNTER — Emergency Department (HOSPITAL_COMMUNITY)
Admission: EM | Admit: 2014-09-09 | Discharge: 2014-09-09 | Disposition: A | Discharge status: Home / Self Care | Payer: Medicaid Other | Attending: Emergency Medicine | Admitting: Emergency Medicine

## 2014-09-09 ENCOUNTER — Emergency Department (HOSPITAL_COMMUNITY): Payer: Medicaid Other

## 2014-09-09 DIAGNOSIS — R05 Cough: Secondary | ICD-10-CM

## 2014-09-09 DIAGNOSIS — M329 Systemic lupus erythematosus, unspecified: Secondary | ICD-10-CM

## 2014-09-09 DIAGNOSIS — R059 Cough, unspecified: Secondary | ICD-10-CM

## 2014-09-09 LAB — URINALYSIS, ROUTINE W REFLEX MICROSCOPIC
Bilirubin Urine: NEGATIVE
GLUCOSE, UA: NEGATIVE mg/dL
Hgb urine dipstick: NEGATIVE
Ketones, ur: NEGATIVE mg/dL
Nitrite: NEGATIVE
PROTEIN: NEGATIVE mg/dL
SPECIFIC GRAVITY, URINE: 1.017 (ref 1.005–1.030)
UROBILINOGEN UA: 0.2 mg/dL (ref 0.0–1.0)
pH: 6 (ref 5.0–8.0)

## 2014-09-09 LAB — C-REACTIVE PROTEIN

## 2014-09-09 LAB — COMPREHENSIVE METABOLIC PANEL
ALK PHOS: 42 U/L (ref 39–117)
ALT: 9 U/L (ref 0–35)
AST: 14 U/L (ref 0–37)
Albumin: 3.8 g/dL (ref 3.5–5.2)
Anion gap: 8 (ref 5–15)
BUN: 9 mg/dL (ref 6–23)
CHLORIDE: 107 mmol/L (ref 96–112)
CO2: 23 mmol/L (ref 19–32)
Calcium: 9.1 mg/dL (ref 8.4–10.5)
Creatinine, Ser: 0.7 mg/dL (ref 0.50–1.10)
GFR calc non Af Amer: >90 mL/min (ref >90)
Glucose, Bld: 94 mg/dL (ref 70–99)
POTASSIUM: 3.6 mmol/L (ref 3.5–5.1)
SODIUM: 138 mmol/L (ref 135–145)
TOTAL PROTEIN: 6.7 g/dL (ref 6.0–8.3)
Total Bilirubin: 0.4 mg/dL (ref 0.3–1.2)

## 2014-09-09 LAB — URINE MICROSCOPIC-ADD ON

## 2014-09-09 LAB — PREGNANCY, URINE: Preg Test, Ur: NEGATIVE

## 2014-09-09 LAB — SEDIMENTATION RATE: Sed Rate: 15 mm/hr (ref 0–22)

## 2014-09-09 MED ORDER — HYDROMORPHONE HCL 1 MG/ML IJ SOLN
1.0000 mg | Freq: Once | INTRAMUSCULAR | Status: AC
  Administered 2014-09-09: 1 mg via INTRAVENOUS
  Filled 2014-09-09: qty 1

## 2014-09-09 MED ORDER — TRAMADOL HCL 50 MG PO TABS: 50.0000 mg | ORAL_TABLET | Freq: Four times a day (QID) | ORAL | Status: DC | PRN

## 2014-09-09 MED ORDER — ONDANSETRON HCL 4 MG/2ML IJ SOLN
4.0000 mg | Freq: Once | INTRAMUSCULAR | Status: AC
  Administered 2014-09-09: 4 mg via INTRAVENOUS

## 2014-09-09 MED ORDER — DEXAMETHASONE SODIUM PHOSPHATE 10 MG/ML IJ SOLN
10.0000 mg | Freq: Once | INTRAMUSCULAR | Status: AC
  Administered 2014-09-09: 10 mg via INTRAVENOUS
  Filled 2014-09-09: qty 1

## 2014-09-09 MED ORDER — PREDNISONE 50 MG PO TABS: 50.0000 mg | ORAL_TABLET | Freq: Every day | ORAL | Status: DC

## 2014-09-09 MED ORDER — ONDANSETRON HCL 4 MG/2ML IJ SOLN
INTRAMUSCULAR | Status: AC
  Filled 2014-09-09: qty 2

## 2014-09-09 MED ORDER — METHOCARBAMOL 500 MG PO TABS: 500.0000 mg | ORAL_TABLET | Freq: Two times a day (BID) | ORAL | Status: DC

## 2014-09-09 MED ORDER — DIAZEPAM 5 MG PO TABS
5.0000 mg | ORAL_TABLET | Freq: Once | ORAL | Status: AC
  Administered 2014-09-09: 5 mg via ORAL
  Filled 2014-09-09: qty 1
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
## 2014-09-09 NOTE — ED Notes (Addendum)
Pt states that her generalized pain has decreased to a 4/10, but is now reporting central chest pain that radiates to the back. Kathrynn Humble, MD notified.

## 2014-09-09 NOTE — ED Notes (Signed)
Pt states that her chest pain is mostly gone, but it still hurts when she takes deep breaths. Kathrynn Humble, MD notified.

## 2014-09-09 NOTE — Discharge Instructions (Signed)
You must see a lupus doctor. We have given you information on a couple of Rheumatologist in the area.  Call [facility] to see if they can see you again. Return to the Er if the symptoms get worse.  Lupus Lupus (also called systemic lupus erythematosus, SLE) is a disorder of the body's natural defense system (immune system). In lupus, the immune system attacks various areas of the body (autoimmune disease). CAUSES The cause is unknown. However, lupus runs in families. Certain genes can make you more likely to develop lupus. It is 10 times more common in women than in men. Lupus is also more common in African Americans and Asians. Other factors also play a role, such as viruses (Epstein-Barr virus, EBV), stress, hormones, cigarette smoke, and certain drugs. SYMPTOMS Lupus can affect many parts of the body, including the joints, skin, kidneys, lungs, heart, nervous system, and blood vessels. The signs and symptoms of lupus differ from person to person. The disease can range from mild to life-threatening. Typical features of lupus include:  Butterfly-shaped rash over the face.  Arthritis involving one or more joints.  Kidney disease.  Fever, weight loss, hair loss, fatigue.  Poor circulation in the fingers and toes (Raynaud's disease).  Chest pain when taking deep breaths. Abdominal pain may also occur.  Skin rash in areas exposed to the sun.  Sores in the mouth and nose. DIAGNOSIS Diagnosing lupus can take a long time and is often difficult. An exam and an accurate account of your symptoms and health problems is very important. Blood tests are necessary, though no single test can confirm or rule out lupus. Most people with lupus test positive for antinuclear antibodies (ANA) on a blood test. Additional blood tests, a urine test (urinalysis), and sometimes a kidney or skin tissue sample (biopsy) can help to confirm or rule out lupus. TREATMENT There is no cure for lupus. Your caregiver  will develop a treatment plan based on your age, sex, health, symptoms, and lifestyle. The goals are to prevent flares, to treat them when they do occur, and to minimize organ damage and complications. How the disease may affect each person varies widely. Most people with lupus can live normal lives, but this disorder must be carefully monitored. Treatment must be adjusted as necessary to prevent serious complications. Medicines used for treatment:  Nonsteroidal anti-inflammatory drugs (NSAIDs) decrease inflammation and can help with chest pain, joint pain, and fevers. Examples include ibuprofen and naproxen.  Antimalarial drugs were designed to treat malaria. They also treat fatigue, joint pain, skin rashes, and inflammation of the lungs in patients with lupus.  Corticosteroids are powerful hormones that rapidly suppress inflammation. The lowest dose with the highest benefit will be chosen. They can be given by cream, pills, injections, and through the vein (intravenously).  Immunosuppressive drugs block the making of immune cells. They may be used for kidney or nerve disease. HOME CARE INSTRUCTIONS  Exercise. Low-impact activities can usually help keep joints flexible without being too strenuous.  Rest after periods of exercise.  Avoid excessive sun exposure.  Follow proper nutrition and take supplements as recommended by your caregiver.  Stress management can be helpful. SEEK MEDICAL CARE IF:  You have increased fatigue.  You develop pain.  You develop a rash.  You have an oral temperature above 102 F (38.9 C).  You develop abdominal discomfort.  You develop a headache.  You experience dizziness. [place] of Neurological Disorders and Stroke: [employer] of Rheumatology:  www.rheumatology.[facility] of Arthritis and Musculoskeletal and Skin Diseases: www.niams.[employer] Document Released: 07/17/2002 Document  Revised: 10/19/2011 Document Reviewed: 11/07/2009 [facility] Patient Information 2015 [place], [place]. This information is not intended to replace advice given to you by your health care provider. Make sure you discuss any questions you have with your health care provider.

## 2014-09-09 NOTE — ED Notes (Signed)
Pt called out. This RN went to room to find the pt on her knees, curled up in a ball, crying. Pt requested something for pain, and this RN told the pt that she had spoken with the doctor, and that he was going to come see her.

## 2014-09-10 ENCOUNTER — Encounter (HOSPITAL_COMMUNITY): Payer: Self-pay | Admitting: Neurology

## 2014-09-10 ENCOUNTER — Emergency Department (HOSPITAL_COMMUNITY)
Admission: EM | Admit: 2014-09-10 | Discharge: 2014-09-10 | Disposition: A | Discharge status: Home / Self Care | Payer: Medicaid Other | Attending: Emergency Medicine | Admitting: Emergency Medicine

## 2014-09-10 ENCOUNTER — Telehealth: Payer: Self-pay | Admitting: Pediatrics

## 2014-09-10 DIAGNOSIS — M79605 Pain in left leg: Secondary | ICD-10-CM

## 2014-09-10 DIAGNOSIS — G43909 Migraine, unspecified, not intractable, without status migrainosus: Secondary | ICD-10-CM | POA: Insufficient documentation

## 2014-09-10 DIAGNOSIS — Z7951 Long term (current) use of inhaled steroids: Secondary | ICD-10-CM | POA: Diagnosis not present

## 2014-09-10 DIAGNOSIS — M25551 Pain in right hip: Secondary | ICD-10-CM | POA: Insufficient documentation

## 2014-09-10 DIAGNOSIS — Z880 Allergy status to penicillin: Secondary | ICD-10-CM | POA: Diagnosis not present

## 2014-09-10 DIAGNOSIS — Z792 Long term (current) use of antibiotics: Secondary | ICD-10-CM | POA: Diagnosis not present

## 2014-09-10 DIAGNOSIS — M25561 Pain in right knee: Secondary | ICD-10-CM | POA: Insufficient documentation

## 2014-09-10 DIAGNOSIS — M25562 Pain in left knee: Secondary | ICD-10-CM | POA: Insufficient documentation

## 2014-09-10 DIAGNOSIS — M79604 Pain in right leg: Secondary | ICD-10-CM

## 2014-09-10 DIAGNOSIS — Z79899 Other long term (current) drug therapy: Secondary | ICD-10-CM | POA: Diagnosis not present

## 2014-09-10 DIAGNOSIS — Z8781 Personal history of (healed) traumatic fracture: Secondary | ICD-10-CM | POA: Diagnosis not present

## 2014-09-10 DIAGNOSIS — Z91040 Latex allergy status: Secondary | ICD-10-CM | POA: Diagnosis not present

## 2014-09-10 DIAGNOSIS — Z7952 Long term (current) use of systemic steroids: Secondary | ICD-10-CM | POA: Insufficient documentation

## 2014-09-10 LAB — CBC WITH DIFFERENTIAL/PLATELET
BASOS PCT: 0 % (ref 0–1)
Basophils Absolute: 0 10*3/uL (ref 0.0–0.1)
Eosinophils Absolute: 0 10*3/uL (ref 0.0–0.7)
Eosinophils Relative: 0 % (ref 0–5)
HCT: 36.4 % (ref 36.0–46.0)
HEMOGLOBIN: 12.2 g/dL (ref 12.0–15.0)
Lymphocytes Relative: 6 % — ABNORMAL LOW (ref 12–46)
Lymphs Abs: 1 10*3/uL (ref 0.7–4.0)
MCH: 31 pg (ref 26.0–34.0)
MCHC: 33.5 g/dL (ref 30.0–36.0)
MCV: 92.4 fL (ref 78.0–100.0)
MONO ABS: 0.3 10*3/uL (ref 0.1–1.0)
Monocytes Relative: 2 % — ABNORMAL LOW (ref 3–12)
Neutro Abs: 15.2 10*3/uL — ABNORMAL HIGH (ref 1.7–7.7)
Neutrophils Relative %: 92 % — ABNORMAL HIGH (ref 43–77)
Platelets: 288 10*3/uL (ref 150–400)
RBC: 3.94 MIL/uL (ref 3.87–5.11)
RDW: 13.2 % (ref 11.5–15.5)
WBC: 16.5 10*3/uL — AB (ref 4.0–10.5)

## 2014-09-10 LAB — COMPREHENSIVE METABOLIC PANEL
ALK PHOS: 42 U/L (ref 39–117)
ALT: 11 U/L (ref 0–35)
AST: 17 U/L (ref 0–37)
Albumin: 3.6 g/dL (ref 3.5–5.2)
Anion gap: 6 (ref 5–15)
BILIRUBIN TOTAL: 0.3 mg/dL (ref 0.3–1.2)
BUN: 8 mg/dL (ref 6–23)
CO2: 24 mmol/L (ref 19–32)
Calcium: 8.9 mg/dL (ref 8.4–10.5)
Chloride: 108 mmol/L (ref 96–112)
Creatinine, Ser: 0.72 mg/dL (ref 0.50–1.10)
GFR calc non Af Amer: >90 mL/min (ref >90)
GLUCOSE: 139 mg/dL — AB (ref 70–99)
Potassium: 3.8 mmol/L (ref 3.5–5.1)
Sodium: 138 mmol/L (ref 135–145)
Total Protein: 6.7 g/dL (ref 6.0–8.3)

## 2014-09-10 LAB — CK: Total CK: 34 U/L (ref 7–177)

## 2014-09-10 LAB — LIPASE, BLOOD: Lipase: 25 U/L (ref 11–59)

## 2014-09-10 MED ORDER — ONDANSETRON HCL 4 MG/2ML IJ SOLN
4.0000 mg | Freq: Once | INTRAMUSCULAR | Status: AC
  Administered 2014-09-10: 4 mg via INTRAVENOUS
  Filled 2014-09-10: qty 2

## 2014-09-10 MED ORDER — MORPHINE SULFATE 4 MG/ML IJ SOLN
8.0000 mg | Freq: Once | INTRAMUSCULAR | Status: AC
  Administered 2014-09-10: 8 mg via INTRAVENOUS
  Filled 2014-09-10: qty 2

## 2014-09-10 MED ORDER — DIAZEPAM 5 MG/ML IJ SOLN
5.0000 mg | Freq: Once | INTRAMUSCULAR | Status: AC
  Administered 2014-09-10: 5 mg via INTRAVENOUS
  Filled 2014-09-10: qty 2

## 2014-09-10 MED ORDER — KETOROLAC TROMETHAMINE 30 MG/ML IJ SOLN
30.0000 mg | Freq: Once | INTRAMUSCULAR | Status: AC
  Administered 2014-09-10: 30 mg via INTRAVENOUS
  Filled 2014-09-10: qty 1

## 2014-09-10 NOTE — Telephone Encounter (Signed)
Called mother's phone and was given [name]'s personal number [phone]). Called out of concern that she was in the ED yesterday and was told she is on her way back to ED due to chest discomfort. Discussed with Nuvia that we need to get her transitioned to adult care at [facility] and get her to specialty clinic (Rheumatology) due to her SLE. Asked her to call after ED visit and schedule follow-up with me so I can give her the information she needs and make sure she has appropriate medications in the meantime. Espyn voiced understanding and ability to follow through; she was pleasant and polite, as always.

## 2014-09-10 NOTE — Discharge Instructions (Signed)

## 2014-09-10 NOTE — ED Notes (Signed)
Pt reports generalized body aches, thinks it is her lupus flare up. Also legs feels stiff, took prednisone this morning but threw it up. Has has n/v.
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
## 2014-09-11 ENCOUNTER — Telehealth: Payer: Self-pay | Admitting: Pediatrics

## 2014-09-11 NOTE — Telephone Encounter (Signed)
Mom calling asking for referral to specialist as per your conversation with her on 09/10/14. Mom wanting to know if I was able to get her a "doctor who is specialized in treating patients with Lupus" I did not see a referral in system and told mom I was sending you a message so you can contact her with arrangements.

## 2014-09-12 NOTE — Telephone Encounter (Signed)
Requested scheduler contact [name] for an in-office follow-up on her ED visit. Will then set referral to Rheumatology (most likely the best specialist for her issues) and aid transition to adult care at [facility].

## 2014-09-13 ENCOUNTER — Other Ambulatory Visit: Payer: Self-pay | Admitting: Pediatrics

## 2014-09-13 ENCOUNTER — Encounter: Payer: Self-pay | Admitting: Pediatrics

## 2014-09-13 DIAGNOSIS — M329 Systemic lupus erythematosus, unspecified: Secondary | ICD-10-CM

## 2014-09-17 ENCOUNTER — Telehealth: Payer: Self-pay | Admitting: *Deleted

## 2014-09-17 NOTE — Telephone Encounter (Signed)
Patient is interested in having her Nexplanon removed and a new one inserted. Current Nexplanon was placed 07/30/11.  Attempted to contact patient and left message with her mother to have her contact the office.

## 2014-09-18 NOTE — Telephone Encounter (Signed)
Patient contacted the office. Attempted to contact the patient and left a message for patient to call back. Patient left a different number than what is in the chart. [phone].

## 2014-09-18 NOTE — Telephone Encounter (Signed)
Spoke with patient and scheduled her Nexplanon removal and insertion of a new one on 09-21-14 @ 10:30 am.

## 2014-09-21 ENCOUNTER — Ambulatory Visit (INDEPENDENT_AMBULATORY_CARE_PROVIDER_SITE_OTHER): Payer: Medicaid Other | Admitting: Obstetrics

## 2014-09-21 ENCOUNTER — Ambulatory Visit: Payer: Medicaid Other | Admitting: Pediatrics

## 2014-09-27 ENCOUNTER — Ambulatory Visit: Payer: Medicaid Other | Admitting: Pediatrics

## 2014-10-10 ENCOUNTER — Telehealth: Payer: Self-pay | Admitting: *Deleted

## 2014-10-10 NOTE — Telephone Encounter (Signed)
Patient contacted the office requesting to reschedule her Nexplanon removal an insertion.  Attempted to contact the patient and left message for patient to contact the office.

## 2014-10-11 ENCOUNTER — Encounter (HOSPITAL_COMMUNITY): Payer: Self-pay | Admitting: *Deleted

## 2014-10-11 ENCOUNTER — Emergency Department (HOSPITAL_COMMUNITY)
Admission: EM | Admit: 2014-10-11 | Discharge: 2014-10-11 | Disposition: A | Discharge status: Home / Self Care | Payer: Medicaid Other | Attending: Emergency Medicine | Admitting: Emergency Medicine

## 2014-10-11 DIAGNOSIS — Z862 Personal history of diseases of the blood and blood-forming organs and certain disorders involving the immune mechanism: Secondary | ICD-10-CM | POA: Diagnosis not present

## 2014-10-11 DIAGNOSIS — H538 Other visual disturbances: Secondary | ICD-10-CM | POA: Diagnosis not present

## 2014-10-11 DIAGNOSIS — R531 Weakness: Secondary | ICD-10-CM | POA: Diagnosis not present

## 2014-10-11 DIAGNOSIS — R42 Dizziness and giddiness: Secondary | ICD-10-CM | POA: Diagnosis not present

## 2014-10-11 DIAGNOSIS — Z7952 Long term (current) use of systemic steroids: Secondary | ICD-10-CM | POA: Insufficient documentation

## 2014-10-11 DIAGNOSIS — Z3202 Encounter for pregnancy test, result negative: Secondary | ICD-10-CM | POA: Insufficient documentation

## 2014-10-11 DIAGNOSIS — Z880 Allergy status to penicillin: Secondary | ICD-10-CM | POA: Insufficient documentation

## 2014-10-11 DIAGNOSIS — M1990 Unspecified osteoarthritis, unspecified site: Secondary | ICD-10-CM | POA: Diagnosis not present

## 2014-10-11 DIAGNOSIS — Z79899 Other long term (current) drug therapy: Secondary | ICD-10-CM | POA: Insufficient documentation

## 2014-10-11 DIAGNOSIS — Z793 Long term (current) use of hormonal contraceptives: Secondary | ICD-10-CM | POA: Insufficient documentation

## 2014-10-11 DIAGNOSIS — M329 Systemic lupus erythematosus, unspecified: Secondary | ICD-10-CM | POA: Diagnosis present

## 2014-10-11 DIAGNOSIS — Z91040 Latex allergy status: Secondary | ICD-10-CM | POA: Insufficient documentation

## 2014-10-11 DIAGNOSIS — Z8781 Personal history of (healed) traumatic fracture: Secondary | ICD-10-CM | POA: Insufficient documentation

## 2014-10-11 LAB — CBC WITH DIFFERENTIAL/PLATELET
Basophils Absolute: 0 10*3/uL (ref 0.0–0.1)
Basophils Relative: 0 % (ref 0–1)
Eosinophils Absolute: 0.1 10*3/uL (ref 0.0–0.7)
Eosinophils Relative: 2 % (ref 0–5)
HCT: 36.3 % (ref 36.0–46.0)
Hemoglobin: 12.1 g/dL (ref 12.0–15.0)
Lymphocytes Relative: 43 % (ref 12–46)
Lymphs Abs: 3.1 10*3/uL (ref 0.7–4.0)
MCH: 31.3 pg (ref 26.0–34.0)
MCHC: 33.3 g/dL (ref 30.0–36.0)
MCV: 94 fL (ref 78.0–100.0)
Monocytes Absolute: 0.4 10*3/uL (ref 0.1–1.0)
Monocytes Relative: 6 % (ref 3–12)
Neutro Abs: 3.5 10*3/uL (ref 1.7–7.7)
Neutrophils Relative %: 49 % (ref 43–77)
PLATELETS: 241 10*3/uL (ref 150–400)
RBC: 3.86 MIL/uL — AB (ref 3.87–5.11)
RDW: 13.3 % (ref 11.5–15.5)
WBC: 7.2 10*3/uL (ref 4.0–10.5)

## 2014-10-11 LAB — URINALYSIS, ROUTINE W REFLEX MICROSCOPIC
Bilirubin Urine: NEGATIVE
Glucose, UA: NEGATIVE mg/dL
Hgb urine dipstick: NEGATIVE
KETONES UR: NEGATIVE mg/dL
Nitrite: NEGATIVE
PROTEIN: NEGATIVE mg/dL
Specific Gravity, Urine: 1.018 (ref 1.005–1.030)
Urobilinogen, UA: 1 mg/dL (ref 0.0–1.0)
pH: 7.5 (ref 5.0–8.0)

## 2014-10-11 LAB — I-STAT CHEM 8, ED
BUN: 8 mg/dL (ref 6–23)
CREATININE: 0.6 mg/dL (ref 0.50–1.10)
Calcium, Ion: 1.17 mmol/L (ref 1.12–1.23)
Chloride: 106 mmol/L (ref 96–112)
GLUCOSE: 98 mg/dL (ref 70–99)
HEMATOCRIT: 38 % (ref 36.0–46.0)
Hemoglobin: 12.9 g/dL (ref 12.0–15.0)
Potassium: 4.2 mmol/L (ref 3.5–5.1)
SODIUM: 141 mmol/L (ref 135–145)
TCO2: 18 mmol/L (ref 0–100)

## 2014-10-11 LAB — URINE MICROSCOPIC-ADD ON

## 2014-10-11 LAB — POC URINE PREG, ED: PREG TEST UR: NEGATIVE

## 2014-10-11 MED ORDER — ONDANSETRON HCL 4 MG/2ML IJ SOLN
4.0000 mg | Freq: Once | INTRAMUSCULAR | Status: AC
  Administered 2014-10-11: 4 mg via INTRAVENOUS
  Filled 2014-10-11: qty 2

## 2014-10-11 MED ORDER — KETOROLAC TROMETHAMINE 30 MG/ML IJ SOLN
30.0000 mg | Freq: Once | INTRAMUSCULAR | Status: AC
  Administered 2014-10-11: 30 mg via INTRAVENOUS
  Filled 2014-10-11: qty 1

## 2014-10-11 MED ORDER — TRAMADOL HCL 50 MG PO TABS: 50.0000 mg | ORAL_TABLET | Freq: Four times a day (QID) | ORAL | Status: DC | PRN

## 2014-10-11 MED ORDER — PREDNISONE 50 MG PO TABS: 50.0000 mg | ORAL_TABLET | Freq: Every day | ORAL | Status: DC

## 2014-10-11 MED ORDER — SODIUM CHLORIDE 0.9 % IV BOLUS (SEPSIS)
1000.0000 mL | Freq: Once | INTRAVENOUS | Status: AC
  Administered 2014-10-11: 1000 mL via INTRAVENOUS

## 2014-10-11 MED ORDER — PREDNISONE 20 MG PO TABS
60.0000 mg | ORAL_TABLET | Freq: Once | ORAL | Status: AC
  Administered 2014-10-11: 60 mg via ORAL
  Filled 2014-10-11: qty 3

## 2014-10-11 NOTE — Discharge Instructions (Signed)
Your pain may be related to Lupus flare.  Take steroid and Ultram as prescribed.  Follow up with the Lupus clinic at [facility] for further management.  Return if your condition worsen or if you have other concerns.  Lupus Lupus (also called systemic lupus erythematosus, SLE) is a disorder of the body's natural defense system (immune system). In lupus, the immune system attacks various areas of the body (autoimmune disease). CAUSES The cause is unknown. However, lupus runs in families. Certain genes can make you more likely to develop lupus. It is 10 times more common in women than in men. Lupus is also more common in African Americans and Asians. Other factors also play a role, such as viruses (Epstein-Barr virus, EBV), stress, hormones, cigarette smoke, and certain drugs. SYMPTOMS Lupus can affect many parts of the body, including the joints, skin, kidneys, lungs, heart, nervous system, and blood vessels. The signs and symptoms of lupus differ from person to person. The disease can range from mild to life-threatening. Typical features of lupus include:  Butterfly-shaped rash over the face.  Arthritis involving one or more joints.  Kidney disease.  Fever, weight loss, hair loss, fatigue.  Poor circulation in the fingers and toes (Raynaud's disease).  Chest pain when taking deep breaths. Abdominal pain may also occur.  Skin rash in areas exposed to the sun.  Sores in the mouth and nose. DIAGNOSIS Diagnosing lupus can take a long time and is often difficult. An exam and an accurate account of your symptoms and health problems is very important. Blood tests are necessary, though no single test can confirm or rule out lupus. Most people with lupus test positive for antinuclear antibodies (ANA) on a blood test. Additional blood tests, a urine test (urinalysis), and sometimes a kidney or skin tissue sample (biopsy) can help to confirm or rule out lupus. TREATMENT There is no cure for lupus.  Your caregiver will develop a treatment plan based on your age, sex, health, symptoms, and lifestyle. The goals are to prevent flares, to treat them when they do occur, and to minimize organ damage and complications. How the disease may affect each person varies widely. Most people with lupus can live normal lives, but this disorder must be carefully monitored. Treatment must be adjusted as necessary to prevent serious complications. Medicines used for treatment:  Nonsteroidal anti-inflammatory drugs (NSAIDs) decrease inflammation and can help with chest pain, joint pain, and fevers. Examples include ibuprofen and naproxen.  Antimalarial drugs were designed to treat malaria. They also treat fatigue, joint pain, skin rashes, and inflammation of the lungs in patients with lupus.  Corticosteroids are powerful hormones that rapidly suppress inflammation. The lowest dose with the highest benefit will be chosen. They can be given by cream, pills, injections, and through the vein (intravenously).  Immunosuppressive drugs block the making of immune cells. They may be used for kidney or nerve disease. HOME CARE INSTRUCTIONS  Exercise. Low-impact activities can usually help keep joints flexible without being too strenuous.  Rest after periods of exercise.  Avoid excessive sun exposure.  Follow proper nutrition and take supplements as recommended by your caregiver.  Stress management can be helpful. SEEK MEDICAL CARE IF:  You have increased fatigue.  You develop pain.  You develop a rash.  You have an oral temperature above 102 F (38.9 C).  You develop abdominal discomfort.  You develop a headache.  You experience dizziness. [place] of [facility]rological Disorders and Stroke: MasterBoxes.it American College of  Rheumatology: www.rheumatology.[facility] of Arthritis and Musculoskeletal and Skin Diseases: www.niams.SouthExposed.es Document Released:  07/17/2002 Document Revised: 10/19/2011 Document Reviewed: 11/07/2009 [facility] Patient Information 2015 [place], [place]. This information is not intended to replace advice given to you by your health care provider. Make sure you discuss any questions you have with your health care provider.

## 2014-10-11 NOTE — ED Notes (Signed)
Patient ran out of lupus meds at the end of last month, approx 2 weeks.  She is having pain all over for the past 4 days.  Patient has tried meds w/o relief.  Patient is alert.  Able to answer questions.  Patient states her skin is burning as well.  Patient is to see [facility] at the end of this month for lupus.
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
## 2014-10-13 LAB — URINE CULTURE: Colony Count: >=100000

## 2014-10-14 ENCOUNTER — Encounter (HOSPITAL_COMMUNITY): Payer: Self-pay | Admitting: *Deleted

## 2014-10-14 ENCOUNTER — Emergency Department (HOSPITAL_COMMUNITY)
Admission: EM | Admit: 2014-10-14 | Discharge: 2014-10-14 | Disposition: A | Discharge status: Home / Self Care | Payer: Medicaid Other | Attending: Emergency Medicine | Admitting: Emergency Medicine

## 2014-10-14 DIAGNOSIS — Z862 Personal history of diseases of the blood and blood-forming organs and certain disorders involving the immune mechanism: Secondary | ICD-10-CM | POA: Insufficient documentation

## 2014-10-14 DIAGNOSIS — X58XXXA Exposure to other specified factors, initial encounter: Secondary | ICD-10-CM | POA: Insufficient documentation

## 2014-10-14 DIAGNOSIS — S00552A Superficial foreign body of oral cavity, initial encounter: Secondary | ICD-10-CM

## 2014-10-14 DIAGNOSIS — Z880 Allergy status to penicillin: Secondary | ICD-10-CM | POA: Insufficient documentation

## 2014-10-14 DIAGNOSIS — Z91040 Latex allergy status: Secondary | ICD-10-CM | POA: Insufficient documentation

## 2014-10-14 DIAGNOSIS — Z7952 Long term (current) use of systemic steroids: Secondary | ICD-10-CM | POA: Insufficient documentation

## 2014-10-14 DIAGNOSIS — Y9389 Activity, other specified: Secondary | ICD-10-CM | POA: Insufficient documentation

## 2014-10-14 DIAGNOSIS — Z8781 Personal history of (healed) traumatic fracture: Secondary | ICD-10-CM | POA: Insufficient documentation

## 2014-10-14 DIAGNOSIS — Z793 Long term (current) use of hormonal contraceptives: Secondary | ICD-10-CM | POA: Insufficient documentation

## 2014-10-14 DIAGNOSIS — Y998 Other external cause status: Secondary | ICD-10-CM | POA: Insufficient documentation

## 2014-10-14 DIAGNOSIS — Y9289 Other specified places as the place of occurrence of the external cause: Secondary | ICD-10-CM | POA: Insufficient documentation

## 2014-10-14 DIAGNOSIS — Z79899 Other long term (current) drug therapy: Secondary | ICD-10-CM | POA: Insufficient documentation

## 2014-10-14 DIAGNOSIS — M1990 Unspecified osteoarthritis, unspecified site: Secondary | ICD-10-CM | POA: Insufficient documentation

## 2014-10-14 MED ORDER — BENZOCAINE 20 % MT SOLN
Freq: Once | OROMUCOSAL | Status: DC
  Filled 2014-10-14: qty 57

## 2014-10-14 NOTE — ED Notes (Signed)
Pt reports getting her tongue pierced 4 days ago and now having pain and swelling. Airway intact.

## 2014-10-14 NOTE — Discharge Instructions (Signed)
Please gargle with salt water several times daily to help decrease risk of infection.  Avoid using tongue ring due to risk of infection.  Follow up with your doctor for further care.

## 2014-10-14 NOTE — ED Notes (Signed)
Declined W/C at D/C and was escorted to lobby by RN. 
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
## 2014-10-16 NOTE — Telephone Encounter (Signed)
Patient has been scheduled for October 19, 2014 @ 11 am for a Nexplanon Removal and Insertion of a New Nexplanon

## 2014-10-19 ENCOUNTER — Ambulatory Visit: Payer: Medicaid Other | Admitting: Obstetrics

## 2014-11-18 ENCOUNTER — Emergency Department (HOSPITAL_COMMUNITY)
Admission: EM | Admit: 2014-11-18 | Discharge: 2014-11-18 | Disposition: A | Discharge status: Home / Self Care | Payer: Medicaid Other | Attending: Emergency Medicine | Admitting: Emergency Medicine

## 2014-11-18 ENCOUNTER — Encounter (HOSPITAL_COMMUNITY): Payer: Self-pay | Admitting: *Deleted

## 2014-11-18 DIAGNOSIS — Z79899 Other long term (current) drug therapy: Secondary | ICD-10-CM | POA: Diagnosis not present

## 2014-11-18 DIAGNOSIS — M069 Rheumatoid arthritis, unspecified: Secondary | ICD-10-CM | POA: Insufficient documentation

## 2014-11-18 DIAGNOSIS — Z7951 Long term (current) use of inhaled steroids: Secondary | ICD-10-CM | POA: Diagnosis not present

## 2014-11-18 DIAGNOSIS — J020 Streptococcal pharyngitis: Secondary | ICD-10-CM | POA: Insufficient documentation

## 2014-11-18 DIAGNOSIS — Z91040 Latex allergy status: Secondary | ICD-10-CM | POA: Diagnosis not present

## 2014-11-18 DIAGNOSIS — Z7952 Long term (current) use of systemic steroids: Secondary | ICD-10-CM | POA: Insufficient documentation

## 2014-11-18 DIAGNOSIS — M1990 Unspecified osteoarthritis, unspecified site: Secondary | ICD-10-CM | POA: Insufficient documentation

## 2014-11-18 DIAGNOSIS — Z3202 Encounter for pregnancy test, result negative: Secondary | ICD-10-CM | POA: Insufficient documentation

## 2014-11-18 DIAGNOSIS — G43909 Migraine, unspecified, not intractable, without status migrainosus: Secondary | ICD-10-CM | POA: Diagnosis not present

## 2014-11-18 DIAGNOSIS — Z8781 Personal history of (healed) traumatic fracture: Secondary | ICD-10-CM | POA: Insufficient documentation

## 2014-11-18 DIAGNOSIS — H9201 Otalgia, right ear: Secondary | ICD-10-CM | POA: Diagnosis not present

## 2014-11-18 DIAGNOSIS — Z792 Long term (current) use of antibiotics: Secondary | ICD-10-CM | POA: Insufficient documentation

## 2014-11-18 DIAGNOSIS — M3210 Systemic lupus erythematosus, organ or system involvement unspecified: Secondary | ICD-10-CM | POA: Diagnosis not present

## 2014-11-18 DIAGNOSIS — J029 Acute pharyngitis, unspecified: Secondary | ICD-10-CM | POA: Diagnosis present

## 2014-11-18 DIAGNOSIS — Z880 Allergy status to penicillin: Secondary | ICD-10-CM | POA: Diagnosis not present

## 2014-11-18 LAB — RAPID STREP SCREEN (MED CTR MEBANE ONLY): STREPTOCOCCUS, GROUP A SCREEN (DIRECT): POSITIVE — AB

## 2014-11-18 LAB — POC URINE PREG, ED: PREG TEST UR: NEGATIVE

## 2014-11-18 MED ORDER — AZITHROMYCIN 250 MG PO TABS: 500.0000 mg | ORAL_TABLET | Freq: Every day | ORAL | Status: DC

## 2014-11-18 NOTE — ED Notes (Signed)
Declined W/C at D/C and was escorted to lobby by RN. 
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
## 2014-11-18 NOTE — ED Notes (Signed)
Pt reports onset this am of sore throat and bilateral ear pain. Denies fever. Daughter recently had strep. Airway intact.

## 2014-11-18 NOTE — Discharge Instructions (Signed)
Please call your doctor for a followup appointment within 24-48 hours. When you talk to your doctor please let them know that you were seen in the emergency department and have them acquire all of your records so that they can discuss the findings with you and formulate a treatment plan to fully care for your new and ongoing problems. Please follow-up with her primary care provider Please rest and stay hydrated Please take antibiotics as prescribed on a full stomach Please drink plenty of water Please continue to monitor symptoms closely and if symptoms are to worsen or change (fever greater than 101, chills, sweating, nausea, vomiting, chest pain, shortness of breathe, difficulty breathing, weakness, numbness, tingling, worsening or changes to pain pattern, facial swelling, neck swelling, inability to swallow, throat closing sensation, coughing up blood) please report back to the Emergency Department immediately.   Strep Throat Strep throat is an infection of the throat caused by a bacteria named Streptococcus pyogenes. Your health care provider may call the infection streptococcal "tonsillitis" or "pharyngitis" depending on whether there are signs of inflammation in the tonsils or back of the throat. Strep throat is most common in children aged 5-15 years during the cold months of the year, but it can occur in people of any age during any season. This infection is spread from person to person (contagious) through coughing, sneezing, or other close contact. SIGNS AND SYMPTOMS   Fever or chills.  Painful, swollen, red tonsils or throat.  Pain or difficulty when swallowing.  White or yellow spots on the tonsils or throat.  Swollen, tender lymph nodes or "glands" of the neck or under the jaw.  Red rash all over the body (rare). DIAGNOSIS  Many different infections can cause the same symptoms. A test must be done to confirm the diagnosis so the right treatment can be given. A "rapid strep test"  can help your health care provider make the diagnosis in a few minutes. If this test is not available, a light swab of the infected area can be used for a throat culture test. If a throat culture test is done, results are usually available in a day or two. TREATMENT  Strep throat is treated with antibiotic medicine. HOME CARE INSTRUCTIONS   Gargle with 1 tsp of salt in 1 cup of warm water, 3-4 times per day or as needed for comfort.  Family members who also have a sore throat or fever should be tested for strep throat and treated with antibiotics if they have the strep infection.  Make sure everyone in your household washes their hands well.  Do not share food, drinking cups, or personal items that could cause the infection to spread to others.  You may need to eat a soft food diet until your sore throat gets better.  Drink enough water and fluids to keep your urine clear or pale yellow. This will help prevent dehydration.  Get plenty of rest.  Stay home from school, day care, or work until you have been on antibiotics for 24 hours.  Take medicines only as directed by your health care provider.  Take your antibiotic medicine as directed by your health care provider. Finish it even if you start to feel better. SEEK MEDICAL CARE IF:   The glands in your neck continue to enlarge.  You develop a rash, cough, or earache.  You cough up green, yellow-brown, or bloody sputum.  You have pain or discomfort not controlled by medicines.  Your problems seem to be  getting worse rather than better.  You have a fever. SEEK IMMEDIATE MEDICAL CARE IF:   You develop any new symptoms such as vomiting, severe headache, stiff or painful neck, chest pain, shortness of breath, or trouble swallowing.  You develop severe throat pain, drooling, or changes in your voice.  You develop swelling of the neck, or the skin on the neck becomes red and tender.  You develop signs of dehydration, such as  fatigue, dry mouth, and decreased urination.  You become increasingly sleepy, or you cannot wake up completely. MAKE SURE YOU:  Understand these instructions.  Will watch your condition.  Will get help right away if you are not doing well or get worse. Document Released: 07/24/2000 Document Revised: 12/11/2013 Document Reviewed: 09/25/2010 [facility] Patient Information 2015 [place], [place]. This information is not intended to replace advice given to you by your health care provider. Make sure you discuss any questions you have with your health care provider.   Emergency Department Resource Guide 1) Find a Doctor and Pay Out of Pocket Although you won't have to find out who is covered by your insurance plan, it is a good idea to ask around and get recommendations. You will then need to call the office and see if the doctor you have chosen will accept you as a new patient and what types of options they offer for patients who are self-pay. Some doctors offer discounts or will set up payment plans for their patients who do not have insurance, but you will need to ask so you aren't surprised when you get to your appointment.  2) Contact Your Local Health Department Not all health departments have doctors that can see patients for sick visits, but many do, so it is worth a call to see if yours does. If you don't know where your local health department is, you can check in your phone book. The CDC also has a tool to help you locate your state's health department, and many state websites also have listings of all of their local health departments.  3) Find a [facility] If your illness is not likely to be very severe or complicated, you may want to try a walk in clinic. These are popping up all over the country in pharmacies, drugstores, and shopping centers. They're usually staffed by nurse practitioners or physician assistants that have been trained to treat common illnesses and complaints. They're  usually fairly quick and inexpensive. However, if you have serious medical issues or chronic medical problems, these are probably not your best option.  No Primary Care Doctor: - Call Health Connect at  [phone] - they can help you locate a primary care doctor that  accepts your insurance, provides certain services, etc. - Physician Referral Service- [phone]  Chronic Pain Problems: Organization         Address  Phone   Notes  [facility]  [phone] Patients need to be referred by their primary care doctor.   Medication Assistance: Organization         Address  Phone   Notes  [facility] Medication [facility] [address] [place], [place] [address] [phone] --Must be a resident of [facility] -- Must have NO insurance coverage whatsoever (no Medicaid/ Medicare, etc.) -- The pt. MUST have a primary care doctor that directs their care regularly and follows them in the community   [facility]ist  [phone]   [address]  [phone]  Agencies that provide inexpensive medical care: Organization         Address  Phone   Notes  [place]  [phone]   [facility]    [phone]   [facility] [place], [place] [address] [phone]   [place] [address], [place] [phone]   Planned Parenthood    [phone]   [facility]    [phone]   [place] and [address], [place] Phone:  [phone], Fax:  [phone] Hours of Operation:  9 am - 6 pm, M-F.  Also accepts Medicaid/Medicare and self-pay.  [facility] for [place], [address], [place] Phone: [phone], Fax: [phone]. Hours of Operation:  8:30 am - 5:30 pm, M-F.  Also accepts Medicaid and self-pay.  [facility] [place] [address] [place] Phone:  [phone]   [place], [place], [place] [phone], Ext. [phone] Mondays & Thursdays: 7-9 AM.  First 15 patients are seen on a first come, first serve basis.    [place] Providers:  Organization         Address  Phone   Notes  [facility] [address],  [phone] Also accepts self-pay patients.  [facility] [address], [place]  [phone]   [place], [address], [place] [phone]   [facility] [address], [place] [phone]   Lucianne Lei [address], [place]   [phone] Only accepts [place] Access [place] patients after they have their name applied to their card.   Self-Pay (no insurance) in [facility]:  Organization         Address  Phone   Notes  Sickle Cell Patients, [facility] [place] [phone]   [facility] [place] [phone]   [facility] [place]  [place], [place], [place] [phone]   [facility]/Dr. Osei-Bonsu  [address] [place] or [address], [place] [phone] Phone number for both [place] and [place] locations is the same.  [facility] [address], [place] [phone]   [facility] [address] [place] or [address] [phone]   [facility] [address] [place] [phone], phone; [phone], fax Sees patients 1st and 3rd Saturday of every month.  Must not qualify for public or private insurance (i.e. Medicaid, Medicare, [place] Health Choice, Veterans' Benefits)  Household income should be no more than 200% of the poverty level The clinic cannot treat  you if you are pregnant or think you are pregnant  Sexually transmitted diseases are not treated at the clinic.    Dental Care: Organization         Address  Phone  Notes  [facility] Department of [facility] [place] [phone] Accepts children up to age 66 who are enrolled in [place] or [place]; pregnant women with a Medicaid card; and children who have applied for Medicaid or  [place] Health Choice, but were declined, whose parents can pay a reduced fee at time of service.  [facility] Department of [facility]  [address], [place] [phone] Accepts children up to age 68 who are enrolled in [place] or [place]; pregnant women with a Medicaid card; and children who have applied for Medicaid or [place] Health Choice, but were declined, whose parents can pay a reduced fee at time of service.  [place] Adult Dental Access PROGRAM  [place] [phone] Patients are seen by appointment only. Walk-ins are not accepted. [place] will see patients 63 years of age and older. Monday - Tuesday (8am-5pm) Most Wednesdays (8:30-5pm) $30 per visit, cash only  [facility] Adult Dental Access PROGRAM  [address], [facility] [phone] Patients are seen by appointment only. Walk-ins are not accepted. [place]re[place] will see patients 98 years of age and older. One Wednesday Evening (Monthly: Volunteer Based).  $30 per visit, cash only  [place]  [phone] for adults; Children under age 46, call Graduate Pediatric Dentistry at [phone]. Children aged 43-14, please call [phone] to request a pediatric application.  Dental services are provided in all areas of dental care including fillings, crowns and bridges, complete and partial dentures, implants, gum treatment, root canals, and extractions. Preventive care is also provided. Treatment  is provided to both adults and children. Patients are selected via a lottery and there is often a waiting list.   [facility] [address] [place]  [phone] [email or web address]   [facility] [address] [place], [place] [phone], Ext. [phone] Second and Fourth Thursday of each month, opens at 6:30 AM; Clinic ends at 9 AM.  Patients are seen on a first-come first-served basis, and a limited number are seen during each clinic.   [facility]  [address] Hillard Danker [place], [place] [phone]   Eligibility Requirements You must have lived in [place], [place], or [place] counties for at least the last three months.   You cannot be eligible for state or federal sponsored [employer], including [employer], [place], or [employer].   You generally cannot be eligible for healthcare insurance through your employer.    How to apply: Eligibility screenings are held every Tuesday and Wednesday afternoon from 1:00 pm until 4:00 pm. You do not need an appointment for the interview!  [facility] [address], [place], [place]   [place]  [place] Department  [place]  [phone]    Behavioral Health Resources in the Community: Intensive Outpatient Programs Organization         Address  Phone  Notes  [place]. [address] [place], [place] [phone]   [facility] [address], [place], [place]   ADS: Alcohol & Drug S[facility] [address] [place], [place]   [place] [address]  [place], [place] or [phone]   Substance Abuse Resources Organization         Address  Phone  Notes  Alcohol and Drug Services  [phone]   [facility]  [phone]   The  [place]  [phone]   Chinita Pester  [phone]   Residential & Outpatient Substance Abuse Program  [phone]  Psychological Services Organization         Address  Phone  Notes  [facility] [place]  [place]  [phone]   [place] [address], [place] or [phone]    Mobile Crisis Teams Organization         Address  Phone  Notes  [facility], Mobile Crisis Care Unit  [phone]   [facility]otherapeutic [facility]  [address] [place], [place]   [place] [address] [place] [phone]    Self-Help/Support Groups Organization         Address  Phone             Notes  [place]. of [place] - variety of support groups  [place] Call for more information  Narcotics Anonymous (NA), [facility] [address], [employer] [place]  2 meetings at this location   Special educational needs teacher         Address  Phone  Notes  [facility] Residential Treatment [place],    [place]  [phone]   [facility]  [address] [place] [phone], [place], [place]   [place], [place] [phone] Admissions: 8am-3pm M-F  Incentives Substance [place] [address],    [place], [place] [phone]   [facility] [address] [place], [place], [place]   [facility] [address]  [place], [place]   [facility]ight Programs - Intensive Outpatient [address] Kristeen [address], [place], [place]   [facility] [address].,  [place], [place] [phone] or [phone]   [facility] (RT[facility]) [address], [place], [place] Accepts Medicaid  Fellowship Reader [address]  [place] [phone] Substance Abuse/Addiction Treatment     [facility] Organization         Address  Phone  Notes  [facility]  [phone]   Domenic Schwab, PhD [address] Arlis Porta [place], [place]   [phone] or [phone]   [place], [place] [phone]   [facility] [address], [place], [place] [phone] Insurance/Medicaid/sponsorship through [facility] and Families [address]                                    [place], [place] [phone] [place] [address], [place] [phone]    Dr. Adele Schilder  [phone]   [facility] Dept. 1) [address] [place] 2) [place] 3)  [place] [address], [place] [phone]  [phone]   [place] [phone] or [phone] (After Hours)

## 2014-11-19 ENCOUNTER — Emergency Department (HOSPITAL_COMMUNITY): Payer: Medicaid Other

## 2014-11-19 ENCOUNTER — Encounter (HOSPITAL_COMMUNITY): Payer: Self-pay | Admitting: Emergency Medicine

## 2014-11-19 ENCOUNTER — Inpatient Hospital Stay (HOSPITAL_COMMUNITY)
Admission: EM | Admit: 2014-11-19 | Discharge: 2014-11-21 | DRG: 759 | Disposition: A | Discharge status: Home / Self Care | Payer: Medicaid Other | Attending: Internal Medicine | Admitting: Internal Medicine

## 2014-11-19 DIAGNOSIS — M329 Systemic lupus erythematosus, unspecified: Secondary | ICD-10-CM | POA: Diagnosis present

## 2014-11-19 DIAGNOSIS — A599 Trichomoniasis, unspecified: Secondary | ICD-10-CM | POA: Diagnosis present

## 2014-11-19 DIAGNOSIS — R112 Nausea with vomiting, unspecified: Secondary | ICD-10-CM

## 2014-11-19 DIAGNOSIS — R1110 Vomiting, unspecified: Secondary | ICD-10-CM | POA: Diagnosis not present

## 2014-11-19 DIAGNOSIS — R7881 Bacteremia: Secondary | ICD-10-CM | POA: Insufficient documentation

## 2014-11-19 DIAGNOSIS — G43109 Migraine with aura, not intractable, without status migrainosus: Secondary | ICD-10-CM

## 2014-11-19 DIAGNOSIS — R1031 Right lower quadrant pain: Secondary | ICD-10-CM

## 2014-11-19 DIAGNOSIS — M797 Fibromyalgia: Secondary | ICD-10-CM | POA: Diagnosis present

## 2014-11-19 DIAGNOSIS — D509 Iron deficiency anemia, unspecified: Secondary | ICD-10-CM | POA: Diagnosis present

## 2014-11-19 DIAGNOSIS — N730 Acute parametritis and pelvic cellulitis: Secondary | ICD-10-CM

## 2014-11-19 DIAGNOSIS — J020 Streptococcal pharyngitis: Secondary | ICD-10-CM

## 2014-11-19 DIAGNOSIS — M069 Rheumatoid arthritis, unspecified: Secondary | ICD-10-CM | POA: Diagnosis present

## 2014-11-19 DIAGNOSIS — A5909 Other urogenital trichomoniasis: Secondary | ICD-10-CM

## 2014-11-19 DIAGNOSIS — N739 Female pelvic inflammatory disease, unspecified: Principal | ICD-10-CM | POA: Diagnosis present

## 2014-11-19 HISTORY — DX: Depression, unspecified: F32.A

## 2014-11-19 HISTORY — DX: Unspecified asthma, uncomplicated: J45.909

## 2014-11-19 HISTORY — DX: Other chronic pain: G89.29

## 2014-11-19 HISTORY — DX: Anxiety disorder, unspecified: F41.9

## 2014-11-19 HISTORY — DX: Major depressive disorder, single episode, unspecified: F32.9

## 2014-11-19 HISTORY — DX: Fibromyalgia: M79.7

## 2014-11-19 HISTORY — DX: Unspecified chronic bronchitis: J42

## 2014-11-19 HISTORY — DX: Dorsalgia, unspecified: M54.9

## 2014-11-19 LAB — CBC WITH DIFFERENTIAL/PLATELET
BASOS ABS: 0 10*3/uL (ref 0.0–0.1)
BASOS PCT: 0 % (ref 0–1)
Eosinophils Absolute: 0 10*3/uL (ref 0.0–0.7)
Eosinophils Relative: 0 % (ref 0–5)
HCT: 38.2 % (ref 36.0–46.0)
Hemoglobin: 12.9 g/dL (ref 12.0–15.0)
Lymphocytes Relative: 10 % — ABNORMAL LOW (ref 12–46)
Lymphs Abs: 2.1 10*3/uL (ref 0.7–4.0)
MCH: 31.5 pg (ref 26.0–34.0)
MCHC: 33.8 g/dL (ref 30.0–36.0)
MCV: 93.4 fL (ref 78.0–100.0)
Monocytes Absolute: 1.6 10*3/uL — ABNORMAL HIGH (ref 0.1–1.0)
Monocytes Relative: 7 % (ref 3–12)
NEUTROS PCT: 83 % — AB (ref 43–77)
Neutro Abs: 18 10*3/uL — ABNORMAL HIGH (ref 1.7–7.7)
Platelets: 239 10*3/uL (ref 150–400)
RBC: 4.09 MIL/uL (ref 3.87–5.11)
RDW: 13.2 % (ref 11.5–15.5)
WBC: 21.8 10*3/uL — ABNORMAL HIGH (ref 4.0–10.5)

## 2014-11-19 LAB — WET PREP, GENITAL: Yeast Wet Prep HPF POC: NONE SEEN

## 2014-11-19 LAB — URINALYSIS, ROUTINE W REFLEX MICROSCOPIC
Glucose, UA: NEGATIVE mg/dL
Ketones, ur: >80 mg/dL — AB
Nitrite: NEGATIVE
PH: 6 (ref 5.0–8.0)
PROTEIN: 30 mg/dL — AB
SPECIFIC GRAVITY, URINE: 1.025 (ref 1.005–1.030)
UROBILINOGEN UA: 1 mg/dL (ref 0.0–1.0)

## 2014-11-19 LAB — COMPREHENSIVE METABOLIC PANEL
ALBUMIN: 3.6 g/dL (ref 3.5–5.2)
ALK PHOS: 58 U/L (ref 39–117)
ALT: 20 U/L (ref 0–35)
AST: 22 U/L (ref 0–37)
Anion gap: 10 (ref 5–15)
BUN: 7 mg/dL (ref 6–23)
CO2: 22 mmol/L (ref 19–32)
Calcium: 8.6 mg/dL (ref 8.4–10.5)
Chloride: 104 mmol/L (ref 96–112)
Creatinine, Ser: 0.7 mg/dL (ref 0.50–1.10)
GFR calc Af Amer: >90 mL/min (ref >90)
GFR calc non Af Amer: >90 mL/min (ref >90)
Glucose, Bld: 94 mg/dL (ref 70–99)
Potassium: 3.7 mmol/L (ref 3.5–5.1)
SODIUM: 136 mmol/L (ref 135–145)
TOTAL PROTEIN: 7.3 g/dL (ref 6.0–8.3)
Total Bilirubin: 0.9 mg/dL (ref 0.3–1.2)

## 2014-11-19 LAB — URINE MICROSCOPIC-ADD ON

## 2014-11-19 LAB — I-STAT CG4 LACTIC ACID, ED
Lactic Acid, Venous: 0.89 mmol/L (ref 0.5–2.0)
Lactic Acid, Venous: 0.95 mmol/L (ref 0.5–2.0)

## 2014-11-19 LAB — I-STAT TROPONIN, ED: Troponin i, poc: 0 ng/mL (ref 0.00–0.08)

## 2014-11-19 LAB — PREGNANCY, URINE: Preg Test, Ur: NEGATIVE

## 2014-11-19 LAB — POC URINE PREG, ED: PREG TEST UR: NEGATIVE

## 2014-11-19 LAB — MONONUCLEOSIS SCREEN: Mono Screen: NEGATIVE

## 2014-11-19 LAB — POC OCCULT BLOOD, ED: FECAL OCCULT BLD: NEGATIVE

## 2014-11-19 MED ORDER — SODIUM CHLORIDE 0.9 % IV SOLN
INTRAVENOUS | Status: DC
  Administered 2014-11-19 – 2014-11-21 (×3): via INTRAVENOUS

## 2014-11-19 MED ORDER — CIPROFLOXACIN IN D5W 400 MG/200ML IV SOLN
400.0000 mg | Freq: Two times a day (BID) | INTRAVENOUS | Status: DC
  Administered 2014-11-19: 400 mg via INTRAVENOUS
  Filled 2014-11-19 (×2): qty 200

## 2014-11-19 MED ORDER — DEXTROSE 5 % IV SOLN
2.0000 g | Freq: Once | INTRAVENOUS | Status: AC
  Administered 2014-11-19: 2 g via INTRAVENOUS
  Filled 2014-11-19: qty 2

## 2014-11-19 MED ORDER — SODIUM CHLORIDE 0.9 % IV BOLUS (SEPSIS)
1000.0000 mL | Freq: Once | INTRAVENOUS | Status: AC
  Administered 2014-11-19: 1000 mL via INTRAVENOUS

## 2014-11-19 MED ORDER — ADULT MULTIVITAMIN W/MINERALS CH
1.0000 | ORAL_TABLET | Freq: Every day | ORAL | Status: DC
  Administered 2014-11-20 – 2014-11-21 (×2): 1 via ORAL
  Filled 2014-11-19 (×2): qty 1

## 2014-11-19 MED ORDER — MOMETASONE FUROATE 100 MCG/ACT IN AERO: 2.0000 | INHALATION_SPRAY | Freq: Every day | RESPIRATORY_TRACT | Status: DC | PRN

## 2014-11-19 MED ORDER — IOHEXOL 300 MG/ML  SOLN
100.0000 mL | Freq: Once | INTRAMUSCULAR | Status: AC | PRN
  Administered 2014-11-19: 100 mL via INTRAVENOUS

## 2014-11-19 MED ORDER — MORPHINE SULFATE 2 MG/ML IJ SOLN
1.0000 mg | INTRAMUSCULAR | Status: AC | PRN
  Administered 2014-11-20 (×3): 1 mg via INTRAVENOUS
  Filled 2014-11-19 (×3): qty 1

## 2014-11-19 MED ORDER — FOLIC ACID 1 MG PO TABS
1.0000 mg | ORAL_TABLET | Freq: Every day | ORAL | Status: DC
  Administered 2014-11-20 – 2014-11-21 (×2): 1 mg via ORAL
  Filled 2014-11-19 (×2): qty 1

## 2014-11-19 MED ORDER — ACETAMINOPHEN 325 MG PO TABS
650.0000 mg | ORAL_TABLET | Freq: Once | ORAL | Status: AC
  Administered 2014-11-19: 650 mg via ORAL
  Filled 2014-11-19: qty 2

## 2014-11-19 MED ORDER — ETONOGESTREL 68 MG ~~LOC~~ IMPL: 68.0000 mg | DRUG_IMPLANT | Freq: Once | SUBCUTANEOUS | Status: DC

## 2014-11-19 MED ORDER — VITAMIN B-1 100 MG PO TABS
100.0000 mg | ORAL_TABLET | Freq: Every day | ORAL | Status: DC
  Administered 2014-11-20 – 2014-11-21 (×2): 100 mg via ORAL
  Filled 2014-11-19 (×2): qty 1

## 2014-11-19 MED ORDER — ONDANSETRON HCL 4 MG/2ML IJ SOLN
4.0000 mg | Freq: Once | INTRAMUSCULAR | Status: AC
  Administered 2014-11-19: 4 mg via INTRAVENOUS
  Filled 2014-11-19: qty 2

## 2014-11-19 MED ORDER — IOHEXOL 300 MG/ML  SOLN
25.0000 mL | Freq: Once | INTRAMUSCULAR | Status: AC | PRN
  Administered 2014-11-19: 25 mL via ORAL

## 2014-11-19 MED ORDER — FLUTICASONE PROPIONATE HFA 44 MCG/ACT IN AERO
2.0000 | INHALATION_SPRAY | Freq: Every day | RESPIRATORY_TRACT | Status: DC | PRN
  Filled 2014-11-19: qty 10.6

## 2014-11-19 MED ORDER — VANCOMYCIN HCL 10 G IV SOLR
1500.0000 mg | Freq: Two times a day (BID) | INTRAVENOUS | Status: DC
  Administered 2014-11-19: 1500 mg via INTRAVENOUS
  Filled 2014-11-19 (×2): qty 1500

## 2014-11-19 MED ORDER — METRONIDAZOLE 500 MG PO TABS
500.0000 mg | ORAL_TABLET | Freq: Two times a day (BID) | ORAL | Status: DC
  Administered 2014-11-19 – 2014-11-21 (×4): 500 mg via ORAL
  Filled 2014-11-19 (×5): qty 1

## 2014-11-19 MED ORDER — HEPARIN SODIUM (PORCINE) 5000 UNIT/ML IJ SOLN
5000.0000 [IU] | Freq: Three times a day (TID) | INTRAMUSCULAR | Status: DC
  Administered 2014-11-19 – 2014-11-21 (×5): 5000 [IU] via SUBCUTANEOUS
  Filled 2014-11-19 (×7): qty 1

## 2014-11-19 NOTE — ED Notes (Signed)
PA at bedside.

## 2014-11-19 NOTE — ED Provider Notes (Signed)
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 bacteremia. Discussed case in great detail with Dr. Chancy Milroy from [facility]. Dr. Chancy Milroy to admit patient. Surgery consulted. Discussed plan for mission with patient agrees to plan of care. Patient stable for transfer to floor.  Jamse Mead, PA-C 11/19/14 2217  Daleen Bo, MD 11/20/14 [phone]

## 2014-11-19 NOTE — ED Notes (Signed)
Attempted report 

## 2014-11-19 NOTE — ED Notes (Signed)
Pelvic cart at bedside. 
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
## 2014-11-19 NOTE — ED Notes (Signed)
Pt sts sore throat and strep throat diagnosed yesterday; pt sts some N/V and black stool per pt; pt sts some blood in vomit

## 2014-11-19 NOTE — ED Provider Notes (Signed)
  Face-to-face evaluation   History: He is here for worsening sore throat, now associated with lower abdominal pain, vaginal discharge, and black tarry stools. Her pain and stooling abnormalities started yesterday after she was evaluated and treated for a sore throat.  Physical exam: Alert, cooperative, nontoxic in appearance. Abdomen soft, mild lower abdomen tenderness. No upper abdominal tenderness or palpable hepatosplenomegaly.  Medical screening examination/treatment/procedure(s) were conducted as a shared visit with non-physician practitioner(s) and myself.  I personally evaluated the patient during the encounter  Daleen Bo, MD 11/20/14 [phone]

## 2014-11-19 NOTE — ED Notes (Signed)
CT aware patient is finished with oral contrast.
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
## 2014-11-19 NOTE — ED Notes (Signed)
Surgeon in to see patient at this time.

## 2014-11-20 ENCOUNTER — Encounter (HOSPITAL_COMMUNITY): Payer: Self-pay | Admitting: General Practice

## 2014-11-20 DIAGNOSIS — J020 Streptococcal pharyngitis: Secondary | ICD-10-CM | POA: Diagnosis not present

## 2014-11-20 DIAGNOSIS — N730 Acute parametritis and pelvic cellulitis: Secondary | ICD-10-CM

## 2014-11-20 DIAGNOSIS — M329 Systemic lupus erythematosus, unspecified: Secondary | ICD-10-CM | POA: Diagnosis present

## 2014-11-20 DIAGNOSIS — D509 Iron deficiency anemia, unspecified: Secondary | ICD-10-CM | POA: Diagnosis present

## 2014-11-20 DIAGNOSIS — A599 Trichomoniasis, unspecified: Secondary | ICD-10-CM | POA: Diagnosis present

## 2014-11-20 DIAGNOSIS — A5909 Other urogenital trichomoniasis: Secondary | ICD-10-CM

## 2014-11-20 DIAGNOSIS — G43109 Migraine with aura, not intractable, without status migrainosus: Secondary | ICD-10-CM | POA: Diagnosis present

## 2014-11-20 DIAGNOSIS — N739 Female pelvic inflammatory disease, unspecified: Secondary | ICD-10-CM | POA: Diagnosis not present

## 2014-11-20 DIAGNOSIS — R112 Nausea with vomiting, unspecified: Secondary | ICD-10-CM | POA: Diagnosis present

## 2014-11-20 DIAGNOSIS — M069 Rheumatoid arthritis, unspecified: Secondary | ICD-10-CM | POA: Diagnosis present

## 2014-11-20 DIAGNOSIS — M797 Fibromyalgia: Secondary | ICD-10-CM | POA: Diagnosis present

## 2014-11-20 DIAGNOSIS — R1110 Vomiting, unspecified: Secondary | ICD-10-CM | POA: Diagnosis not present

## 2014-11-20 DIAGNOSIS — R7881 Bacteremia: Secondary | ICD-10-CM | POA: Diagnosis not present

## 2014-11-20 LAB — COMPREHENSIVE METABOLIC PANEL
ALT: 17 U/L (ref 0–35)
AST: 19 U/L (ref 0–37)
Albumin: 2.8 g/dL — ABNORMAL LOW (ref 3.5–5.2)
Alkaline Phosphatase: 53 U/L (ref 39–117)
Anion gap: 9 (ref 5–15)
BUN: 5 mg/dL — ABNORMAL LOW (ref 6–23)
CALCIUM: 7.8 mg/dL — AB (ref 8.4–10.5)
CO2: 17 mmol/L — AB (ref 19–32)
Chloride: 111 mmol/L (ref 96–112)
Creatinine, Ser: 0.61 mg/dL (ref 0.50–1.10)
GLUCOSE: 81 mg/dL (ref 70–99)
Potassium: 3.5 mmol/L (ref 3.5–5.1)
Sodium: 137 mmol/L (ref 135–145)
Total Bilirubin: 0.6 mg/dL (ref 0.3–1.2)
Total Protein: 7.3 g/dL (ref 6.0–8.3)

## 2014-11-20 LAB — GC/CHLAMYDIA PROBE AMP (~~LOC~~) NOT AT ARMC
Chlamydia: NEGATIVE
Neisseria Gonorrhea: NEGATIVE

## 2014-11-20 LAB — HIV ANTIBODY (ROUTINE TESTING W REFLEX): HIV Screen 4th Generation wRfx: NONREACTIVE

## 2014-11-20 LAB — TSH: TSH: 1.382 u[IU]/mL (ref 0.350–4.500)

## 2014-11-20 MED ORDER — DEXTROSE 5 % IV SOLN
2.0000 g | Freq: Four times a day (QID) | INTRAVENOUS | Status: DC
  Administered 2014-11-20 (×2): 2 g via INTRAVENOUS
  Filled 2014-11-20 (×5): qty 2

## 2014-11-20 MED ORDER — ONDANSETRON HCL 4 MG/2ML IJ SOLN
4.0000 mg | Freq: Four times a day (QID) | INTRAMUSCULAR | Status: DC | PRN
  Administered 2014-11-20 – 2014-11-21 (×3): 4 mg via INTRAVENOUS
  Filled 2014-11-20 (×3): qty 2

## 2014-11-20 MED ORDER — DOXYCYCLINE HYCLATE 100 MG IV SOLR
100.0000 mg | Freq: Two times a day (BID) | INTRAVENOUS | Status: DC
  Administered 2014-11-20 – 2014-11-21 (×3): 100 mg via INTRAVENOUS
  Filled 2014-11-20 (×4): qty 100

## 2014-11-20 MED ORDER — LORAZEPAM 0.5 MG PO TABS: 0.5000 mg | ORAL_TABLET | Freq: Once | ORAL | Status: AC | PRN

## 2014-11-20 MED ORDER — DEXTROSE 5 % IV SOLN
2.0000 g | Freq: Four times a day (QID) | INTRAVENOUS | Status: DC
  Filled 2014-11-20: qty 2

## 2014-11-20 MED ORDER — PROCHLORPERAZINE EDISYLATE 5 MG/ML IJ SOLN
10.0000 mg | Freq: Once | INTRAMUSCULAR | Status: AC
  Administered 2014-11-20: 10 mg via INTRAVENOUS
  Filled 2014-11-20: qty 2

## 2014-11-20 MED ORDER — SODIUM CHLORIDE 0.9 % IV BOLUS (SEPSIS)
500.0000 mL | Freq: Once | INTRAVENOUS | Status: AC
  Administered 2014-11-20: 500 mL via INTRAVENOUS

## 2014-11-20 MED ORDER — KETOROLAC TROMETHAMINE 30 MG/ML IJ SOLN
30.0000 mg | Freq: Once | INTRAMUSCULAR | Status: AC
  Administered 2014-11-20: 30 mg via INTRAVENOUS
  Filled 2014-11-20: qty 1

## 2014-11-20 MED ORDER — MORPHINE SULFATE 15 MG PO TABS
15.0000 mg | ORAL_TABLET | ORAL | Status: DC | PRN
  Administered 2014-11-20 – 2014-11-21 (×2): 15 mg via ORAL
  Filled 2014-11-20 (×2): qty 1

## 2014-11-20 NOTE — Progress Notes (Signed)
Page MD, Pt c/o nausea, and cramping and tingling sensation in both calfs, states "my legs feel like there going numb"

## 2014-11-20 NOTE — Progress Notes (Signed)
[facility] PROGRESS NOTE  [name] OZH:[id number] DOB: May 31, 1991 DOA: 11/19/2014 PCP: Pcp Not In System  Brief summary  [name] is a 24 y.o. Female with history of RA, lupus who presented with nausea vomiting and abdominal pain. She staes that she came in the day prior to admission with strep throat. She was given a zpk which she states that she never took. She then developed vomiting, nausea, black stools, fevers, chills, and abdominal pain. She denied frank blood in her stools. She stated that she had been taking iron supplements.  Wet prep: trichomonas. GC:Ch pending.  General surgery was consulted due to concern for appendicitis, however, CT scan and exam were unimpressive.  She likely has PID.  She has been started on cefoxitin and doxycycline for PID and flagyl for trichomonas.  Her intractable nausea and vomiting are improving so she advanced her diet.  She also has headache.     Assessment/Plan  PID with intractable nausea and vomiting, improving -  D/c vanc and cipro -  cefoxitin and doxycycline day 1 -  Continue flagyl -  Advance diet -  Continue IVF and antiemetics -  D/c IV narcotics and start oral morphine prn  Strep pharyngitis -  abx as above  Headache with visual changes, family history of migraines in sister -  toradol and compazine x 1  RA/lupus, not on any immunosuppression.  Leukocytosis, likely related to PID and improving -  Repeat WBC in am  Iron deficiency anemia, hemoglobin trending down wiith IVF, likely hemoconcentrated at admission -  occult negative -  Repeat hgb in AM  Diet:  CLD >> regular for dinner Access:  PIV IVF:  yes Proph:  heparin  Code Status: full Family Communication: patient alone Disposition Plan: possibly home tomorrow if able to tolerate a diet.  Of note, patient asked me to write a note for her because she had a court date this morning and I was told she would have been arrested if she no-showed without a  note.    Consultants:  General surgery  Procedures:  CT abd/pelvis  Antibiotics:  Cefoxitin 4/12 >>  Doxycycline 4/12 >>  Flagyl 4/11 >>  vanc 4/11 x 1  cipro 4/11 x 1  HPI/Subjective:  Has severe headache with intermittent blurred vision adn bright lights.  Nausea is improving.  States her pain medication does not last long enough.  Denies diarrhea.  Abdominal pain seems to migrate from side to side.    Objective: Filed Vitals:   11/19/14 2130 11/19/14 2229 11/20/14 0524 11/20/14 1424  BP: 103/67 99/62 101/49 112/43  Pulse: 82 84  90  Temp:  98 F (36.7 C) 98.9 F (37.2 C) 98.1 F (36.7 C)  TempSrc:  Oral Oral Oral  Resp: 16 20 18 16   Height:  5' 2.4" (1.585 m)    Weight:  76.295 kg (168 lb 3.2 oz)    SpO2: 100% 100% 100% 100%    Intake/Output Summary (Last 24 hours) at 11/20/14 1705 Last data filed at 11/20/14 1657  Gross per 24 hour  Intake 1821.67 ml  Output    700 ml  Net 1121.67 ml   Filed Weights   11/19/14 2229  Weight: 76.295 kg (168 lb 3.2 oz)    Exam:   General:  Thin female, No acute distress  HEENT:  NCAT, MMM  Cardiovascular:  RRR, nl S1, S2 no mrg, 2+ pulses, warm extremities  Respiratory:  CTAB, no increased WOB  Abdomen:  NABS, soft, ND, mild TTP around RLQ, umbilicus and LUQ without rebound or guarding  MSK:   Normal tone and bulk, no LEE  Neuro:  Grossly intact  Data Reviewed: Basic Metabolic Panel:  Recent Labs Lab 2014-12-08 1331 11/20/14 0840  NA 136 137  K 3.7 3.5  CL 104 111  CO2 22 17*  GLUCOSE 94 81  BUN 7 5*  CREATININE 0.70 0.61  CALCIUM 8.6 7.8*   Liver Function Tests:  Recent Labs Lab 2014/12/08 1331 11/20/14 0840  AST 22 19  ALT 20 17  ALKPHOS 58 53  BILITOT 0.9 0.6  PROT 7.3 7.3  ALBUMIN 3.6 2.8*   No results for input(s): LIPASE, AMYLASE in the last 168 hours. No results for input(s): AMMONIA in the last 168 hours. CBC:  Recent Labs Lab 2014-12-08 1331 11/20/14 0840  WBC 21.8* 12.6*   NEUTROABS 18.0*  --   HGB 12.9 10.1*  HCT 38.2 30.5*  MCV 93.4 93.8  PLT 239 196   Cardiac Enzymes: No results for input(s): CKTOTAL, CKMB, CKMBINDEX, TROPONINI in the last 168 hours. BNP (last 3 results) No results for input(s): BNP in the last 8760 hours.  ProBNP (last 3 results) No results for input(s): PROBNP in the last 8760 hours.  CBG: No results for input(s): GLUCAP in the last 168 hours.  Recent Results (from the past 240 hour(s))  Rapid strep screen     Status: Abnormal   Collection Time: 11/18/14 10:22 AM  Result Value Ref Range Status   Streptococcus, Group A Screen (Direct) POSITIVE (A) NEGATIVE Final  Wet prep, genital     Status: Abnormal   Collection Time: 08-Dec-2014  5:39 PM  Result Value Ref Range Status   Yeast Wet Prep HPF POC NONE SEEN NONE SEEN Final   Trich, Wet Prep MANY (A) NONE SEEN Final   Clue Cells Wet Prep HPF POC FEW (A) NONE SEEN Final   WBC, Wet Prep HPF POC FEW (A) NONE SEEN Final     Studies: US Transvaginal Non-ob  2014/12/08   CLINICAL DATA:  Right lower quadrant pelvic pain  EXAM: TRANSABDOMINAL AND TRANSVAGINAL ULTRASOUND OF PELVIS  DOPPLER ULTRASOUND OF OVARIES  TECHNIQUE: Both transabdominal and transvaginal ultrasound examinations of the pelvis were performed. Transabdominal technique was performed for global imaging of the pelvis including uterus, ovaries, adnexal regions, and pelvic cul-de-sac.  It was necessary to proceed with endovaginal exam following the transabdominal exam to visualize the endometrium and ovaries. Color and duplex Doppler ultrasound was utilized to evaluate blood flow to the ovaries.  COMPARISON:  01/13/2011  FINDINGS: Uterus  Measurements: 8.4 x 4.5 x 3.2 cm. No fibroids or other mass visualized.  Endometrium  Thickness: 1 mm.  No focal abnormality visualized.  Right ovary  Measurements: 2.6 x 1.9 x 1.5 cm. Physiologic cumulus oophorus cyst incidentally noted. Normal appearance/no adnexal mass.  Left ovary   Measurements: Not visualized. No adnexal mass.  Pulsed Doppler evaluation of both ovaries demonstrates normal low resistance arterial and venous waveforms in the visualized right ovary.  Other findings  Trace free fluid in the cul-de-sac.  IMPRESSION: Normal appearance to the right ovary without sonographic evidence for ovarian torsion. Left ovary not visualized, no left adnexal mass.   Electronically Signed   By: Conchita Paris M.D.   On: 12-08-2014 19:11   US Pelvis Complete  Dec 08, 2014   CLINICAL DATA:  Right lower quadrant pelvic pain  EXAM: TRANSABDOMINAL AND TRANSVAGINAL ULTRASOUND OF PELVIS  DOPPLER ULTRASOUND OF  OVARIES  TECHNIQUE: Both transabdominal and transvaginal ultrasound examinations of the pelvis were performed. Transabdominal technique was performed for global imaging of the pelvis including uterus, ovaries, adnexal regions, and pelvic cul-de-sac.  It was necessary to proceed with endovaginal exam following the transabdominal exam to visualize the endometrium and ovaries. Color and duplex Doppler ultrasound was utilized to evaluate blood flow to the ovaries.  COMPARISON:  01/13/2011  FINDINGS: Uterus  Measurements: 8.4 x 4.5 x 3.2 cm. No fibroids or other mass visualized.  Endometrium  Thickness: 1 mm.  No focal abnormality visualized.  Right ovary  Measurements: 2.6 x 1.9 x 1.5 cm. Physiologic cumulus oophorus cyst incidentally noted. Normal appearance/no adnexal mass.  Left ovary  Measurements: Not visualized. No adnexal mass.  Pulsed Doppler evaluation of both ovaries demonstrates normal low resistance arterial and venous waveforms in the visualized right ovary.  Other findings  Trace free fluid in the cul-de-sac.  IMPRESSION: Normal appearance to the right ovary without sonographic evidence for ovarian torsion. Left ovary not visualized, no left adnexal mass.   Electronically Signed   By: Conchita Paris M.D.   On: 11/19/2014 19:11   Ct Abdomen Pelvis W Contrast  11/19/2014   CLINICAL  DATA:  Nausea and vomiting, patient reports tarry black stool and hematemesis  EXAM: CT ABDOMEN AND PELVIS WITH CONTRAST  TECHNIQUE: Multidetector CT imaging of the abdomen and pelvis was performed using the standard protocol following bolus administration of intravenous contrast.  CONTRAST:  158mL OMNIPAQUE IOHEXOL 300 MG/ML  SOLN  COMPARISON:  12/01/2013  FINDINGS: Lower chest: Minimal dependent bibasilar atelectasis. Lung fields are otherwise clear.  Hepatobiliary: Cholecystectomy clips are noted. Liver is unremarkable.  Pancreas: Normal  Spleen: Normal  Adrenals/Urinary Tract: Adrenal glands are unremarkable. No hydroureteronephrosis. No radiopaque renal, ureteral, or bladder calculus. No perinephric fluid.  Stomach/Bowel: Small air-fluid level noted within the otherwise normal-appearing distal esophagus which may be related to reflux. Stomach, small bowel, large bowel, and appendix appear normal.  Vascular/Lymphatic: No lymphadenopathy.  No aortic aneurysm.  Reproductive: Uterus and ovaries appear unremarkable.  Other: Trace free fluid in the cul-de-sac.  Musculoskeletal: No acute osseous abnormality.  IMPRESSION: No acute intra-abdominal or pelvic pathology.   Electronically Signed   By: Conchita Paris M.D.   On: 11/19/2014 19:56   [place] Art/ven Flow Abd Pelv Doppler  11/19/2014   CLINICAL DATA:  Right lower quadrant pelvic pain  EXAM: TRANSABDOMINAL AND TRANSVAGINAL ULTRASOUND OF PELVIS  DOPPLER ULTRASOUND OF OVARIES  TECHNIQUE: Both transabdominal and transvaginal ultrasound examinations of the pelvis were performed. Transabdominal technique was performed for global imaging of the pelvis including uterus, ovaries, adnexal regions, and pelvic cul-de-sac.  It was necessary to proceed with endovaginal exam following the transabdominal exam to visualize the endometrium and ovaries. Color and duplex Doppler ultrasound was utilized to evaluate blood flow to the ovaries.  COMPARISON:  01/13/2011  FINDINGS: Uterus   Measurements: 8.4 x 4.5 x 3.2 cm. No fibroids or other mass visualized.  Endometrium  Thickness: 1 mm.  No focal abnormality visualized.  Right ovary  Measurements: 2.6 x 1.9 x 1.5 cm. Physiologic cumulus oophorus cyst incidentally noted. Normal appearance/no adnexal mass.  Left ovary  Measurements: Not visualized. No adnexal mass.  Pulsed Doppler evaluation of both ovaries demonstrates normal low resistance arterial and venous waveforms in the visualized right ovary.  Other findings  Trace free fluid in the cul-de-sac.  IMPRESSION: Normal appearance to the right ovary without sonographic evidence for ovarian torsion. Left ovary not  visualized, no left adnexal mass.   Electronically Signed   By: Conchita Paris M.D.   On: 11/19/2014 19:11   Dg Chest Port 1 View  11/19/2014   CLINICAL DATA:  Sore throat, chest pain, hematemesis for 2 days  EXAM: PORTABLE CHEST - 1 VIEW  COMPARISON:  09/09/2014  FINDINGS: The heart size and mediastinal contours are within normal limits. Both lungs are clear. The visualized skeletal structures are unremarkable.  IMPRESSION: No active disease.   Electronically Signed   By: Conchita Paris M.D.   On: 11/19/2014 17:36    Scheduled Meds: . cefOXitin  2 g Intravenous 4 times per day  . doxycycline (VIBRAMYCIN) IV  100 mg Intravenous Q12H  . folic acid  1 mg Oral Daily  . heparin  5,000 Units Subcutaneous 3 times per day  . ketorolac  30 mg Intravenous Once  . metroNIDAZOLE  500 mg Oral Q12H  . multivitamin with minerals  1 tablet Oral Daily  . prochlorperazine  10 mg Intravenous Once  . thiamine  100 mg Oral Daily   Continuous Infusions: . sodium chloride 100 mL/hr at 11/20/14 1509    Principal Problem:   Nausea and vomiting Active Problems:   Strep pharyngitis    Time spent: 30 min    Lucielle Vokes, [facility] Pager [phone]. If 7PM-7AM, please contact night-coverage at [email or web address], password [facility] 11/20/2014, 5:05 PM  LOS: 0 days

## 2014-11-20 NOTE — Progress Notes (Signed)
UR completed 

## 2014-11-20 NOTE — Progress Notes (Signed)
[facility] Progress Note     Subjective: Pts pain is still there but improved.  Ambulating well.  Urinating well. Last BM on 4/10.  Wants liquids.    Objective: Vital signs in last 24 hours: Temp:  [98 F (36.7 C)-100.2 F (37.9 C)] 98.9 F (37.2 C) (04/12 0524) Pulse Rate:  [71-107] 84 (04/11 2229) Resp:  [15-25] 18 (04/12 0524) BP: (99-124)/(49-71) 101/49 mmHg (04/12 0524) SpO2:  [95 %-100 %] 100 % (04/12 0524) Weight:  [76.295 kg (168 lb 3.2 oz)] 76.295 kg (168 lb 3.2 oz) (04/11 2229) Last BM Date: 11/18/14 (Sunday pt states )  Intake/Output from previous day:   Intake/Output this shift: Total I/O In: 0  Out: 100 [Urine:100]  PE: Gen:  Alert, NAD, pleasant Abd: Soft, ND, mild tenderness in RLQ and LUQ, +BS, no HSM, abdominal scars noted  Lab Results:   Recent Labs  11/19/14 1331 11/20/14 0840  WBC 21.8* 12.6*  HGB 12.9 10.1*  HCT 38.2 30.5*  PLT 239 196   BMET  Recent Labs  11/19/14 1331 11/20/14 0840  NA 136 137  K 3.7 3.5  CL 104 111  CO2 22 17*  GLUCOSE 94 81  BUN 7 5*  CREATININE 0.70 0.61  CALCIUM 8.6 7.8*   PT/INR No results for input(s): LABPROT, INR in the last 72 hours. CMP     Component Value Date/Time   NA 137 11/20/2014 0840   K 3.5 11/20/2014 0840   CL 111 11/20/2014 0840   CO2 17* 11/20/2014 0840   GLUCOSE 81 11/20/2014 0840   BUN 5* 11/20/2014 0840   CREATININE 0.61 11/20/2014 0840   CALCIUM 7.8* 11/20/2014 0840   PROT 7.3 11/20/2014 0840   ALBUMIN 2.8* 11/20/2014 0840   AST 19 11/20/2014 0840   ALT 17 11/20/2014 0840   ALKPHOS 53 11/20/2014 0840   BILITOT 0.6 11/20/2014 0840   GFRNONAA >90 11/20/2014 0840   GFRAA >90 11/20/2014 0840   Lipase     Component Value Date/Time   LIPASE 25 09/10/2014 1431       Studies/Results: US Transvaginal Non-ob  11/19/2014   CLINICAL DATA:  Right lower quadrant pelvic pain  EXAM: TRANSABDOMINAL AND TRANSVAGINAL ULTRASOUND OF PELVIS  DOPPLER ULTRASOUND OF OVARIES   TECHNIQUE: Both transabdominal and transvaginal ultrasound examinations of the pelvis were performed. Transabdominal technique was performed for global imaging of the pelvis including uterus, ovaries, adnexal regions, and pelvic cul-de-sac.  It was necessary to proceed with endovaginal exam following the transabdominal exam to visualize the endometrium and ovaries. Color and duplex Doppler ultrasound was utilized to evaluate blood flow to the ovaries.  COMPARISON:  01/13/2011  FINDINGS: Uterus  Measurements: 8.4 x 4.5 x 3.2 cm. No fibroids or other mass visualized.  Endometrium  Thickness: 1 mm.  No focal abnormality visualized.  Right ovary  Measurements: 2.6 x 1.9 x 1.5 cm. Physiologic cumulus oophorus cyst incidentally noted. Normal appearance/no adnexal mass.  Left ovary  Measurements: Not visualized. No adnexal mass.  Pulsed Doppler evaluation of both ovaries demonstrates normal low resistance arterial and venous waveforms in the visualized right ovary.  Other findings  Trace free fluid in the cul-de-sac.  IMPRESSION: Normal appearance to the right ovary without sonographic evidence for ovarian torsion. Left ovary not visualized, no left adnexal mass.   Electronically Signed   By: Conchita Paris M.D.   On: 11/19/2014 19:11   US Pelvis Complete  11/19/2014   CLINICAL DATA:  Right lower quadrant pelvic pain  EXAM: TRANSABDOMINAL AND TRANSVAGINAL ULTRASOUND OF PELVIS  DOPPLER ULTRASOUND OF OVARIES  TECHNIQUE: Both transabdominal and transvaginal ultrasound examinations of the pelvis were performed. Transabdominal technique was performed for global imaging of the pelvis including uterus, ovaries, adnexal regions, and pelvic cul-de-sac.  It was necessary to proceed with endovaginal exam following the transabdominal exam to visualize the endometrium and ovaries. Color and duplex Doppler ultrasound was utilized to evaluate blood flow to the ovaries.  COMPARISON:  01/13/2011  FINDINGS: Uterus  Measurements: 8.4 x  4.5 x 3.2 cm. No fibroids or other mass visualized.  Endometrium  Thickness: 1 mm.  No focal abnormality visualized.  Right ovary  Measurements: 2.6 x 1.9 x 1.5 cm. Physiologic cumulus oophorus cyst incidentally noted. Normal appearance/no adnexal mass.  Left ovary  Measurements: Not visualized. No adnexal mass.  Pulsed Doppler evaluation of both ovaries demonstrates normal low resistance arterial and venous waveforms in the visualized right ovary.  Other findings  Trace free fluid in the cul-de-sac.  IMPRESSION: Normal appearance to the right ovary without sonographic evidence for ovarian torsion. Left ovary not visualized, no left adnexal mass.   Electronically Signed   By: Conchita Paris M.D.   On: 11/19/2014 19:11   Ct Abdomen Pelvis W Contrast  11/19/2014   CLINICAL DATA:  Nausea and vomiting, patient reports tarry black stool and hematemesis  EXAM: CT ABDOMEN AND PELVIS WITH CONTRAST  TECHNIQUE: Multidetector CT imaging of the abdomen and pelvis was performed using the standard protocol following bolus administration of intravenous contrast.  CONTRAST:  123mL OMNIPAQUE IOHEXOL 300 MG/ML  SOLN  COMPARISON:  12/01/2013  FINDINGS: Lower chest: Minimal dependent bibasilar atelectasis. Lung fields are otherwise clear.  Hepatobiliary: Cholecystectomy clips are noted. Liver is unremarkable.  Pancreas: Normal  Spleen: Normal  Adrenals/Urinary Tract: Adrenal glands are unremarkable. No hydroureteronephrosis. No radiopaque renal, ureteral, or bladder calculus. No perinephric fluid.  Stomach/Bowel: Small air-fluid level noted within the otherwise normal-appearing distal esophagus which may be related to reflux. Stomach, small bowel, large bowel, and appendix appear normal.  Vascular/Lymphatic: No lymphadenopathy.  No aortic aneurysm.  Reproductive: Uterus and ovaries appear unremarkable.  Other: Trace free fluid in the cul-de-sac.  Musculoskeletal: No acute osseous abnormality.  IMPRESSION: No acute intra-abdominal  or pelvic pathology.   Electronically Signed   By: Conchita Paris M.D.   On: 11/19/2014 19:56   [place] Art/ven Flow Abd Pelv Doppler  11/19/2014   CLINICAL DATA:  Right lower quadrant pelvic pain  EXAM: TRANSABDOMINAL AND TRANSVAGINAL ULTRASOUND OF PELVIS  DOPPLER ULTRASOUND OF OVARIES  TECHNIQUE: Both transabdominal and transvaginal ultrasound examinations of the pelvis were performed. Transabdominal technique was performed for global imaging of the pelvis including uterus, ovaries, adnexal regions, and pelvic cul-de-sac.  It was necessary to proceed with endovaginal exam following the transabdominal exam to visualize the endometrium and ovaries. Color and duplex Doppler ultrasound was utilized to evaluate blood flow to the ovaries.  COMPARISON:  01/13/2011  FINDINGS: Uterus  Measurements: 8.4 x 4.5 x 3.2 cm. No fibroids or other mass visualized.  Endometrium  Thickness: 1 mm.  No focal abnormality visualized.  Right ovary  Measurements: 2.6 x 1.9 x 1.5 cm. Physiologic cumulus oophorus cyst incidentally noted. Normal appearance/no adnexal mass.  Left ovary  Measurements: Not visualized. No adnexal mass.  Pulsed Doppler evaluation of both ovaries demonstrates normal low resistance arterial and venous waveforms in the visualized right ovary.  Other findings  Trace free fluid in the cul-de-sac.  IMPRESSION: Normal appearance to the  right ovary without sonographic evidence for ovarian torsion. Left ovary not visualized, no left adnexal mass.   Electronically Signed   By: Conchita Paris M.D.   On: 11/19/2014 19:11   Dg Chest Port 1 View  11/19/2014   CLINICAL DATA:  Sore throat, chest pain, hematemesis for 2 days  EXAM: PORTABLE CHEST - 1 VIEW  COMPARISON:  09/09/2014  FINDINGS: The heart size and mediastinal contours are within normal limits. Both lungs are clear. The visualized skeletal structures are unremarkable.  IMPRESSION: No active disease.   Electronically Signed   By: Conchita Paris M.D.   On: 11/19/2014  17:36    Anti-infectives: Anti-infectives    Start     Dose/Rate Route Frequency Ordered Stop   11/20/14 0830  cefOXitin (MEFOXIN) 2 g in dextrose 5 % 50 mL IVPB     2 g 100 mL/hr over 30 Minutes Intravenous 4 times per day 11/20/14 0717     11/20/14 0830  doxycycline (VIBRAMYCIN) 100 mg in dextrose 5 % 250 mL IVPB     100 mg 125 mL/hr over 120 Minutes Intravenous Every 12 hours 11/20/14 0717     11/19/14 2300  ciprofloxacin (CIPRO) IVPB 400 mg  Status:  Discontinued     400 mg 200 mL/hr over 60 Minutes Intravenous Every 12 hours 11/19/14 2250 11/20/14 0717   11/19/14 2300  vancomycin (VANCOCIN) 1,500 mg in sodium chloride 0.9 % 500 mL IVPB  Status:  Discontinued     1,500 mg 250 mL/hr over 120 Minutes Intravenous Every 12 hours 11/19/14 2250 11/20/14 0718   11/19/14 2245  metroNIDAZOLE (FLAGYL) tablet 500 mg     500 mg Oral Every 12 hours 11/19/14 2231 11/26/14 2159   11/19/14 1730  cefTRIAXone (ROCEPHIN) 2 g in dextrose 5 % 50 mL IVPB     2 g 100 mL/hr over 30 Minutes Intravenous  Once 11/19/14 1718 11/19/14 1824       Assessment/Plan Strep pharyngitis N/V RLQ pain -Low suspicion for appendicitis.  Her appendix is normal, contrast fills it, it is not dilated, there is no surrounding inflammation. I believe her abd pain may be more due to PID.  Will need referral to GYN at discharge. -IV abx to cover PID/trich/strep -Clears and advance as tolerated -Will sign off, call with questions or concerns, no follow up with General surgery needed Leukocytosis - improved today Trichmonas Fibromyalgia Depression H/o Lap chole in 2012 and h/o c-section in 2012     [name], [name] 11/20/2014, 10:54 AM Pager: [phone]

## 2014-11-21 DIAGNOSIS — N730 Acute parametritis and pelvic cellulitis: Secondary | ICD-10-CM

## 2014-11-21 DIAGNOSIS — R7881 Bacteremia: Secondary | ICD-10-CM | POA: Insufficient documentation

## 2014-11-21 LAB — BASIC METABOLIC PANEL
Anion gap: 5 (ref 5–15)
BUN: <5 mg/dL — ABNORMAL LOW (ref 6–23)
CHLORIDE: 113 mmol/L — AB (ref 96–112)
CO2: 23 mmol/L (ref 19–32)
Calcium: 7.8 mg/dL — ABNORMAL LOW (ref 8.4–10.5)
Creatinine, Ser: 0.51 mg/dL (ref 0.50–1.10)
GFR calc Af Amer: >90 mL/min (ref >90)
Glucose, Bld: 88 mg/dL (ref 70–99)
POTASSIUM: 3.6 mmol/L (ref 3.5–5.1)
SODIUM: 141 mmol/L (ref 135–145)

## 2014-11-21 LAB — CBC
HEMATOCRIT: 30.5 % — AB (ref 36.0–46.0)
HEMATOCRIT: 29.4 % — AB (ref 36.0–46.0)
HEMOGLOBIN: 9.9 g/dL — AB (ref 12.0–15.0)
Hemoglobin: 10.1 g/dL — ABNORMAL LOW (ref 12.0–15.0)
MCH: 31.1 pg (ref 26.0–34.0)
MCH: 31.5 pg (ref 26.0–34.0)
MCHC: 33.1 g/dL (ref 30.0–36.0)
MCHC: 33.7 g/dL (ref 30.0–36.0)
MCV: 93.8 fL (ref 78.0–100.0)
MCV: 93.6 fL (ref 78.0–100.0)
PLATELETS: 196 10*3/uL (ref 150–400)
Platelets: 176 10*3/uL (ref 150–400)
RBC: 3.25 MIL/uL — ABNORMAL LOW (ref 3.87–5.11)
RBC: 3.14 MIL/uL — ABNORMAL LOW (ref 3.87–5.11)
RDW: 13.2 % (ref 11.5–15.5)
RDW: 13.4 % (ref 11.5–15.5)
WBC: 12.6 10*3/uL — ABNORMAL HIGH (ref 4.0–10.5)
WBC: 5.8 10*3/uL (ref 4.0–10.5)

## 2014-11-21 LAB — GLUCOSE, CAPILLARY: GLUCOSE-CAPILLARY: 111 mg/dL — AB (ref 70–99)

## 2014-11-21 LAB — URINE CULTURE
Colony Count: 40000
Special Requests: NORMAL

## 2014-11-21 MED ORDER — DEXTROSE 5 % IV SOLN
2.0000 g | Freq: Four times a day (QID) | INTRAVENOUS | Status: DC
  Administered 2014-11-21: 2 g via INTRAVENOUS
  Filled 2014-11-21 (×2): qty 2

## 2014-11-21 MED ORDER — MORPHINE SULFATE 15 MG PO TABS: 15.0000 mg | ORAL_TABLET | ORAL | Status: DC | PRN

## 2014-11-21 MED ORDER — PROMETHAZINE HCL 12.5 MG PO TABS: 12.5000 mg | ORAL_TABLET | Freq: Four times a day (QID) | ORAL | Status: DC | PRN

## 2014-11-21 MED ORDER — PROBENECID 500 MG PO TABS
1000.0000 mg | ORAL_TABLET | ORAL | Status: AC
  Administered 2014-11-21: 1000 mg via ORAL
  Filled 2014-11-21: qty 2

## 2014-11-21 MED ORDER — METRONIDAZOLE 500 MG PO TABS: 500.0000 mg | ORAL_TABLET | Freq: Two times a day (BID) | ORAL | Status: AC

## 2014-11-21 MED ORDER — DOXYCYCLINE HYCLATE 100 MG PO TABS: 100.0000 mg | ORAL_TABLET | Freq: Two times a day (BID) | ORAL | Status: AC

## 2014-11-21 NOTE — Progress Notes (Signed)
[place] aware pt bp 81/41, hr 55. MD ordered 500cc bolus. Post 500cc bolus of Normal Saline BP 90/45 hr 52, Paged MD. Awaiting callback. Will continue to monitor.

## 2014-11-21 NOTE — Discharge Instructions (Signed)
Pelvic Inflammatory Disease Pelvic inflammatory disease (PID) is an infection in some or all of the female organs. PID can be in the uterus, ovaries, fallopian tubes, or the surrounding tissues inside the lower belly area (pelvis). HOME CARE   If given, take your antibiotic medicine as told. Finish them even if you start to feel better.  Only take medicine as told by your doctor.  Do not have sex (intercourse) until treatment is done or as told by your doctor.  Tell your sex partner if you have PID. Your partner may need to be treated.  Keep all doctor visits. GET HELP RIGHT AWAY IF:   You have a fever.  You have more belly (abdominal) or lower belly pain.  You have chills.  You have pain when you pee (urinate).  You are not better after 72 hours.  You have more fluid (discharge) coming from your vagina or fluid that is not normal.  You need pain medicine from your doctor.  You throw up (vomit).  You cannot take your medicines.  Your partner has a sexually transmitted disease (STD). MAKE SURE YOU:   Understand these instructions.  Will watch your condition.  Will get help right away if you are not doing well or get worse. Document Released: 10/23/2008 Document Revised: 11/21/2012 Document Reviewed: 07/23/2011 [facility] Patient Information 2015 [place], [place]. This information is not intended to replace advice given to you by your health care provider. Make sure you discuss any questions you have with your health care provider.

## 2014-11-21 NOTE — Progress Notes (Signed)
57 Md aware of pt bp 90/45, hr 52. Instructed to notify if MAP drops below 55. Will continue to monitor pt.

## 2014-11-21 NOTE — Discharge Summary (Signed)
Discharge Summary  [name] VXB:[id number] DOB: 05-01-1991  PCP: Marnee Spring, MD  Admit date: 11/19/2014 Discharge date: 11/21/2014  Time spent: 25 min  Recommendations for Outpatient Follow-up:  1. Patient will follow-up with her OB/GYN, Dr. Jodi Mourning in the next 2-4 weeks 2. New medication: Flagyl 500 mg every 8 hours 2 more weeks 3. New medication: Doxycycline 100 mg every 12 hours 2 more weeks medication:  4. New medication Morphine sulfate 15 mg by mouth every 4 hours when necessary for pain   Discharge Diagnoses:  Active Hospital Problems   Diagnosis Date Noted  . PID (acute pelvic inflammatory disease) 11/20/2014  . Bacteremia   . Migraine headache with aura 11/20/2014  . Trichomonal cervicitis 11/20/2014  . Nausea and vomiting 11/19/2014  . Strep pharyngitis 11/19/2014    Resolved Hospital Problems   Diagnosis Date Noted Date Resolved  No resolved problems to display.    Discharge Condition: Improved, being discharged home  Diet recommendation: Regular diet  Filed Weights   11/19/14 2229  Weight: 76.295 kg (168 lb 3.2 oz)    History of present illness:  24 y.o. Female with history of RA, lupus who presented with nausea vomiting and abdominal pain, admitted on 4/11. She staes that she came in the day prior to admission with strep throat. She was given a zpk which she states that she never took. She then developed vomiting, nausea, black stools, fevers, chills, and abdominal pain. She denied frank blood in her stools. She stated that she had been taking iron supplements.  Hospital Course:  Principal Problem:   PID (acute pelvic inflammatory disease): Evaluated by general surgery however CT scan and exam were not impressive for appendicitis. I suspicious that she likely has pelvic inflammatory disease. Patient started on cefoxitin and doxycycline. Diet was able to be advanced and by 4/14 she was able to tolerate by mouth. She will be discharged on by mouth  doxycycline and Flagyl. She'll follow-up with her PCP Active Problems:   Nausea and vomiting: Felt to be secondary to PID, tolerating by mouth by day of discharge   Strep pharyngitis: Treated with above antibiotics   Migraine headache with aura: Improved by day of discharge   Trichomonal cervicitis: Positive wet prep. Started on Flagyl    Procedures:  None  Consultations:  General surgery  Discharge Exam: BP 99/49 mmHg  Pulse 60  Temp(Src) 97.8 F (36.6 C) (Oral)  Resp 18  Ht 5' 2.4" (1.585 m)  Wt 76.295 kg (168 lb 3.2 oz)  BMI 30.37 kg/m2  SpO2 99%  General:Alert and oriented 3, no acute distress  Cardiovascular: Regular rate and rhythm, S1-S2  Respiratory: Clear to auscultation bilaterally Abdomen: Soft, mild distention, non-specific mild tenderness, positive bowel sounds  Discharge Instructions You were cared for by a hospitalist during your hospital stay. If you have any questions about your discharge medications or the care you received while you were in the hospital after you are discharged, you can call the unit and asked to speak with the hospitalist on call if the hospitalist that took care of you is not available. Once you are discharged, your primary care physician will handle any further medical issues. Please note that NO REFILLS for any discharge medications will be authorized once you are discharged, as it is imperative that you return to your primary care physician (or establish a relationship with a primary care physician if you do not have one) for your aftercare needs so that they can reassess  your need for medications and monitor your lab values.  Discharge Instructions    Diet - low sodium heart healthy    Complete by:  As directed      Increase activity slowly    Complete by:  As directed             Medication List    STOP taking these medications        azithromycin 250 MG tablet  Commonly known as:  ZITHROMAX     ibuprofen 600 MG tablet    Commonly known as:  ADVIL,MOTRIN     levofloxacin 750 MG tablet  Commonly known as:  LEVAQUIN     methocarbamol 500 MG tablet  Commonly known as:  ROBAXIN     metoCLOPramide 10 MG tablet  Commonly known as:  REGLAN     mometasone 50 MCG/ACT nasal spray  Commonly known as:  NASONEX     ondansetron 4 MG disintegrating tablet  Commonly known as:  ZOFRAN ODT     oxymetazoline 0.05 % nasal spray  Commonly known as:  AFRIN NASAL SPRAY     predniSONE 50 MG tablet  Commonly known as:  DELTASONE     traMADol 50 MG tablet  Commonly known as:  ULTRAM      TAKE these medications        doxycycline 100 MG tablet  Commonly known as:  VIBRA-TABS  Take 1 tablet (100 mg total) by mouth 2 (two) times daily.     etonogestrel 68 MG Impl implant  Commonly known as:  NEXPLANON  Inject 68 mg into the skin once. Implanted December 2012     fluocinonide 0.05 % external solution  Commonly known as:  LIDEX  Apply 1 application topically daily as needed (for scalp).     iron polysaccharides 150 MG capsule  Commonly known as:  NIFEREX  Take 150 mg by mouth daily.     metroNIDAZOLE 500 MG tablet  Commonly known as:  FLAGYL  Take 1 tablet (500 mg total) by mouth every 12 (twelve) hours.     Mometasone Furoate 100 MCG/ACT Aero  Inhale 2 puffs into the lungs daily as needed (shortness of breath).     morphine 15 MG tablet  Commonly known as:  MSIR  Take 1 tablet (15 mg total) by mouth every 4 (four) hours as needed for moderate pain or severe pain.     promethazine 12.5 MG tablet  Commonly known as:  PHENERGAN  Take 1 tablet (12.5 mg total) by mouth every 6 (six) hours as needed for nausea or vomiting.       Allergies  Allergen Reactions  . Latex Hives and Rash  . Penicillins Hives and Rash  . Vicodin [Hydrocodone-Acetaminophen] Rash       Follow-up Information    Follow up with HARPER,CHARLES A, MD On 11/21/2014.   Specialty:  Obstetrics and Gynecology   Why:  Patient needs  to call and make appointment @ [phone].   Contact information:   [address] [place] [address] [phone]        The results of significant diagnostics from this hospitalization (including imaging, microbiology, ancillary and laboratory) are listed below for reference.    Significant Diagnostic Studies: US Transvaginal Non-ob  11-20-2014   CLINICAL DATA:  Right lower quadrant pelvic pain  EXAM: TRANSABDOMINAL AND TRANSVAGINAL ULTRASOUND OF PELVIS  DOPPLER ULTRASOUND OF OVARIES  TECHNIQUE: Both transabdominal and transvaginal ultrasound examinations of the pelvis were  performed. Transabdominal technique was performed for global imaging of the pelvis including uterus, ovaries, adnexal regions, and pelvic cul-de-sac.  It was necessary to proceed with endovaginal exam following the transabdominal exam to visualize the endometrium and ovaries. Color and duplex Doppler ultrasound was utilized to evaluate blood flow to the ovaries.  COMPARISON:  01/13/2011  FINDINGS: Uterus  Measurements: 8.4 x 4.5 x 3.2 cm. No fibroids or other mass visualized.  Endometrium  Thickness: 1 mm.  No focal abnormality visualized.  Right ovary  Measurements: 2.6 x 1.9 x 1.5 cm. Physiologic cumulus oophorus cyst incidentally noted. Normal appearance/no adnexal mass.  Left ovary  Measurements: Not visualized. No adnexal mass.  Pulsed Doppler evaluation of both ovaries demonstrates normal low resistance arterial and venous waveforms in the visualized right ovary.  Other findings  Trace free fluid in the cul-de-sac.  IMPRESSION: Normal appearance to the right ovary without sonographic evidence for ovarian torsion. Left ovary not visualized, no left adnexal mass.   Electronically Signed   By: Conchita Paris M.D.   On: 11/19/2014 19:11   US Pelvis Complete  11/19/2014   CLINICAL DATA:  Right lower quadrant pelvic pain  EXAM: TRANSABDOMINAL AND TRANSVAGINAL ULTRASOUND OF PELVIS  DOPPLER ULTRASOUND OF  OVARIES  TECHNIQUE: Both transabdominal and transvaginal ultrasound examinations of the pelvis were performed. Transabdominal technique was performed for global imaging of the pelvis including uterus, ovaries, adnexal regions, and pelvic cul-de-sac.  It was necessary to proceed with endovaginal exam following the transabdominal exam to visualize the endometrium and ovaries. Color and duplex Doppler ultrasound was utilized to evaluate blood flow to the ovaries.  COMPARISON:  01/13/2011  FINDINGS: Uterus  Measurements: 8.4 x 4.5 x 3.2 cm. No fibroids or other mass visualized.  Endometrium  Thickness: 1 mm.  No focal abnormality visualized.  Right ovary  Measurements: 2.6 x 1.9 x 1.5 cm. Physiologic cumulus oophorus cyst incidentally noted. Normal appearance/no adnexal mass.  Left ovary  Measurements: Not visualized. No adnexal mass.  Pulsed Doppler evaluation of both ovaries demonstrates normal low resistance arterial and venous waveforms in the visualized right ovary.  Other findings  Trace free fluid in the cul-de-sac.  IMPRESSION: Normal appearance to the right ovary without sonographic evidence for ovarian torsion. Left ovary not visualized, no left adnexal mass.   Electronically Signed   By: Conchita Paris M.D.   On: 11/19/2014 19:11   Ct Abdomen Pelvis W Contrast  11/19/2014   CLINICAL DATA:  Nausea and vomiting, patient reports tarry black stool and hematemesis  EXAM: CT ABDOMEN AND PELVIS WITH CONTRAST  TECHNIQUE: Multidetector CT imaging of the abdomen and pelvis was performed using the standard protocol following bolus administration of intravenous contrast.  CONTRAST:  168mL OMNIPAQUE IOHEXOL 300 MG/ML  SOLN  COMPARISON:  12/01/2013  FINDINGS: Lower chest: Minimal dependent bibasilar atelectasis. Lung fields are otherwise clear.  Hepatobiliary: Cholecystectomy clips are noted. Liver is unremarkable.  Pancreas: Normal  Spleen: Normal  Adrenals/Urinary Tract: Adrenal glands are unremarkable. No  hydroureteronephrosis. No radiopaque renal, ureteral, or bladder calculus. No perinephric fluid.  Stomach/Bowel: Small air-fluid level noted within the otherwise normal-appearing distal esophagus which may be related to reflux. Stomach, small bowel, large bowel, and appendix appear normal.  Vascular/Lymphatic: No lymphadenopathy.  No aortic aneurysm.  Reproductive: Uterus and ovaries appear unremarkable.  Other: Trace free fluid in the cul-de-sac.  Musculoskeletal: No acute osseous abnormality.  IMPRESSION: No acute intra-abdominal or pelvic pathology.   Electronically Signed   By: Roena Malady.D.  On: 11/19/2014 19:56   [place] Art/ven Flow Abd Pelv Doppler  11/19/2014   CLINICAL DATA:  Right lower quadrant pelvic pain  EXAM: TRANSABDOMINAL AND TRANSVAGINAL ULTRASOUND OF PELVIS  DOPPLER ULTRASOUND OF OVARIES  TECHNIQUE: Both transabdominal and transvaginal ultrasound examinations of the pelvis were performed. Transabdominal technique was performed for global imaging of the pelvis including uterus, ovaries, adnexal regions, and pelvic cul-de-sac.  It was necessary to proceed with endovaginal exam following the transabdominal exam to visualize the endometrium and ovaries. Color and duplex Doppler ultrasound was utilized to evaluate blood flow to the ovaries.  COMPARISON:  01/13/2011  FINDINGS: Uterus  Measurements: 8.4 x 4.5 x 3.2 cm. No fibroids or other mass visualized.  Endometrium  Thickness: 1 mm.  No focal abnormality visualized.  Right ovary  Measurements: 2.6 x 1.9 x 1.5 cm. Physiologic cumulus oophorus cyst incidentally noted. Normal appearance/no adnexal mass.  Left ovary  Measurements: Not visualized. No adnexal mass.  Pulsed Doppler evaluation of both ovaries demonstrates normal low resistance arterial and venous waveforms in the visualized right ovary.  Other findings  Trace free fluid in the cul-de-sac.  IMPRESSION: Normal appearance to the right ovary without sonographic evidence for ovarian  torsion. Left ovary not visualized, no left adnexal mass.   Electronically Signed   By: Conchita Paris M.D.   On: 11/19/2014 19:11   Dg Chest Port 1 View  11/19/2014   CLINICAL DATA:  Sore throat, chest pain, hematemesis for 2 days  EXAM: PORTABLE CHEST - 1 VIEW  COMPARISON:  09/09/2014  FINDINGS: The heart size and mediastinal contours are within normal limits. Both lungs are clear. The visualized skeletal structures are unremarkable.  IMPRESSION: No active disease.   Electronically Signed   By: Conchita Paris M.D.   On: 11/19/2014 17:36    Microbiology: Recent Results (from the past 240 hour(s))  Rapid strep screen     Status: Abnormal   Collection Time: 11/18/14 10:22 AM  Result Value Ref Range Status   Streptococcus, Group A Screen (Direct) POSITIVE (A) NEGATIVE Final  Urine culture     Status: None   Collection Time: 11/19/14  3:53 PM  Result Value Ref Range Status   Specimen Description URINE, RANDOM  Final   Special Requests Normal  Final   Colony Count   Final    40,000 COLONIES/ML Performed at [facility]    Culture   Final    Multiple bacterial morphotypes present, none predominant. Suggest appropriate recollection if clinically indicated. Performed at [facility]    Report Status 11/21/2014 FINAL  Final  Wet prep, genital     Status: Abnormal   Collection Time: 11/19/14  5:39 PM  Result Value Ref Range Status   Yeast Wet Prep HPF POC NONE SEEN NONE SEEN Final   Trich, Wet Prep MANY (A) NONE SEEN Final   Clue Cells Wet Prep HPF POC FEW (A) NONE SEEN Final   WBC, Wet Prep HPF POC FEW (A) NONE SEEN Final  Culture, blood (routine x 2)     Status: None (Preliminary result)   Collection Time: 11/19/14  5:54 PM  Result Value Ref Range Status   Specimen Description BLOOD HAND RIGHT  Final   Special Requests BOTTLES DRAWN AEROBIC AND ANAEROBIC 10CC  Final   Culture   Final           BLOOD CULTURE RECEIVED NO GROWTH TO DATE CULTURE WILL BE HELD FOR 5 DAYS  BEFORE ISSUING A FINAL NEGATIVE  REPORT Performed at [facility]    Report Status PENDING  Incomplete  Culture, blood (routine x 2)     Status: None (Preliminary result)   Collection Time: 11/19/14  6:05 PM  Result Value Ref Range Status   Specimen Description BLOOD RIGHT FOREARM  Final   Special Requests BOTTLES DRAWN AEROBIC AND ANAEROBIC 10CC  Final   Culture   Final           BLOOD CULTURE RECEIVED NO GROWTH TO DATE CULTURE WILL BE HELD FOR 5 DAYS BEFORE ISSUING A FINAL NEGATIVE REPORT Performed at [facility]    Report Status PENDING  Incomplete     Labs: Basic Metabolic Panel:  Recent Labs Lab 11/19/14 1331 11/20/14 0840 11/21/14 0633  NA 136 137 141  K 3.7 3.5 3.6  CL 104 111 113*  CO2 22 17* 23  GLUCOSE 94 81 88  BUN 7 5* <5*  CREATININE 0.70 0.61 0.51  CALCIUM 8.6 7.8* 7.8*   Liver Function Tests:  Recent Labs Lab 11/19/14 1331 11/20/14 0840  AST 22 19  ALT 20 17  ALKPHOS 58 53  BILITOT 0.9 0.6  PROT 7.3 7.3  ALBUMIN 3.6 2.8*   No results for input(s): LIPASE, AMYLASE in the last 168 hours. No results for input(s): AMMONIA in the last 168 hours. CBC:  Recent Labs Lab 11/19/14 1331 11/20/14 0840 11/21/14 0633  WBC 21.8* 12.6* 5.8  NEUTROABS 18.0*  --   --   HGB 12.9 10.1* 9.9*  HCT 38.2 30.5* 29.4*  MCV 93.4 93.8 93.6  PLT 239 196 176   Cardiac Enzymes: No results for input(s): CKTOTAL, CKMB, CKMBINDEX, TROPONINI in the last 168 hours. BNP: BNP (last 3 results) No results for input(s): BNP in the last 8760 hours.  ProBNP (last 3 results) No results for input(s): PROBNP in the last 8760 hours.  CBG:  Recent Labs Lab 11/21/14 0836  GLUCAP 111*       Signed:  Annita Brod  [facility] 11/21/2014, 6:16 PM

## 2014-11-22 LAB — HEMOGLOBIN A1C
Hgb A1c MFr Bld: 5.5 % (ref 4.8–5.6)
Mean Plasma Glucose: 111 mg/dL

## 2014-11-26 ENCOUNTER — Inpatient Hospital Stay: Payer: Self-pay | Admitting: Family Medicine

## 2014-11-26 LAB — CULTURE, BLOOD (ROUTINE X 2)
CULTURE: NO GROWTH
Culture: NO GROWTH

## 2014-12-31 ENCOUNTER — Telehealth: Payer: Self-pay | Admitting: *Deleted

## 2014-12-31 NOTE — Telephone Encounter (Signed)
Patient is interested in having her Nexplanon removed and replaced. Patient's current Nexplanon was interested 07/2011. Attempted to contact the patient and left message for patient to call the office.

## 2014-12-31 NOTE — Telephone Encounter (Signed)
Patient contacted the office and has been scheduled for 01-09-15 @ 10:45 am.

## 2015-01-09 ENCOUNTER — Encounter: Payer: Self-pay | Admitting: Obstetrics

## 2015-01-09 ENCOUNTER — Ambulatory Visit (INDEPENDENT_AMBULATORY_CARE_PROVIDER_SITE_OTHER): Payer: Medicaid Other | Admitting: Obstetrics

## 2015-01-09 ENCOUNTER — Ambulatory Visit: Payer: Self-pay | Admitting: Obstetrics

## 2015-01-09 VITALS — BP 112/68 | HR 72 | Temp 98.2°F | Ht 62.0 in | Wt 169.0 lb

## 2015-01-09 DIAGNOSIS — Z3046 Encounter for surveillance of implantable subdermal contraceptive: Secondary | ICD-10-CM

## 2015-01-09 DIAGNOSIS — Z30017 Encounter for initial prescription of implantable subdermal contraceptive: Secondary | ICD-10-CM

## 2015-01-09 DIAGNOSIS — Z309 Encounter for contraceptive management, unspecified: Secondary | ICD-10-CM

## 2015-01-09 NOTE — Addendum Note (Signed)
Addended by: Baltazar Najjar A on: 01/09/2015 06:01 PM   Modules accepted: Level of Service

## 2015-01-09 NOTE — Progress Notes (Addendum)
[place] /  INSERTION NOTE       Reasons  for removal:  Expiration   A timeout was performed confirming the patient, the procedure and allergy status. The patient's left  arm was palpated and the implant device located. The area was prepped with Betadinex3. The distal end of the device was palpated and 1 cc of 1% lidocaine was injected. A 1 mm incision was made. Any fibrotic tissue was carefully dissected away using blunt and/or sharp dissection. The device was removed in an intact manner. Steri-strips and a sterile dressing were applied to the incision. The patient tolerated the procedure well.  New contraceptive method: Nexplanon    Date of LMP:   None  Contraception used: *Nexplanon  Pregnancy test result:  Lab Results  Component Value Date   PREGTESTUR NEGATIVE 11/19/2014   PREGSERUM NEGATIVE 02/26/2011    Indications:  The patient desires contraception.  She understands risks, benefits, and alternatives to Implanon and would like to proceed.  Anesthesia:   Lidocaine 1% plain.  Procedure:  A time-out was performed confirming the procedure and the patient's allergy status.  The patient's non-dominant was identified as the left arm.  The protection cap was removed. While placing countertraction on the skin, the needle was inserted at a 30 degree angle.  The applicator was held horizontal to the skin; the skin was tented upward as the needle was introduced into the subdermal space.  While holding the applicator in place, the slider was unlocked. The Nexplanon was removed from the field.  The Nexplanon was palpated to ensure proper placement.  EXP:  08 / 2018 LOT #:  [id number] / [id number]   Complications: None  Instructions:  The patient was instructed to remove the dressing in 24 hours and that some bruising is to be expected.  She was advised to use over the counter analgesics as needed for any pain at the site.  She is to keep the area dry for 24 hours and to call if her hand  or arm becomes cold, numb, or blue.  Return visit:  Return in 2 weeks

## 2015-01-24 ENCOUNTER — Ambulatory Visit: Payer: Medicaid Other | Admitting: Obstetrics

## 2015-02-25 ENCOUNTER — Telehealth: Payer: Self-pay | Admitting: *Deleted

## 2015-02-25 NOTE — Telephone Encounter (Signed)
Patient state she would like to have her Nexplanon removed. Patient states she had it inserted about 1 month ago. Patient states since that time her depression has gotten worse. Patient states she has been cutting herself.Patient was instructed to be seen at the [facility] emergency room or [facility]. Patient refuses to be seen at either place. Patient is not currently on medication for depression at this time. Patient has been scheduled for an appointment 02-26-15.

## 2015-02-26 ENCOUNTER — Ambulatory Visit (INDEPENDENT_AMBULATORY_CARE_PROVIDER_SITE_OTHER): Payer: 59 | Admitting: Certified Nurse Midwife

## 2015-02-26 VITALS — BP 132/83 | HR 58 | Temp 98.3°F | Wt 167.8 lb

## 2015-02-26 DIAGNOSIS — F329 Major depressive disorder, single episode, unspecified: Secondary | ICD-10-CM

## 2015-02-26 DIAGNOSIS — Z3009 Encounter for other general counseling and advice on contraception: Secondary | ICD-10-CM | POA: Diagnosis not present

## 2015-02-26 DIAGNOSIS — F32A Depression, unspecified: Secondary | ICD-10-CM

## 2015-02-26 MED ORDER — NORETHIN-ETH ESTRAD-FE BIPHAS 1 MG-10 MCG / 10 MCG PO TABS: 1.0000 | ORAL_TABLET | Freq: Every day | ORAL | Status: DC
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
## 2015-02-27 ENCOUNTER — Other Ambulatory Visit: Payer: Self-pay | Admitting: Certified Nurse Midwife

## 2015-02-27 ENCOUNTER — Ambulatory Visit: Payer: Self-pay | Admitting: Certified Nurse Midwife

## 2015-02-28 ENCOUNTER — Other Ambulatory Visit: Payer: Self-pay | Admitting: Certified Nurse Midwife

## 2015-02-28 ENCOUNTER — Telehealth: Payer: Self-pay | Admitting: *Deleted

## 2015-02-28 NOTE — Telephone Encounter (Signed)
Patient had promised [name] that she would go to [facility] to be assessed last night, but did not go. Call to patient - she states she is doing ok and she plans to go and check in tomorrow night. Told patient that we would call her back to see how she was doing and she said she was fine and was going.

## 2015-02-28 NOTE — Telephone Encounter (Signed)
Thank you :)

## 2015-03-02 ENCOUNTER — Emergency Department (HOSPITAL_COMMUNITY)
Admission: EM | Admit: 2015-03-02 | Discharge: 2015-03-02 | Disposition: A | Discharge status: Home / Self Care | Payer: Medicaid Other | Attending: Emergency Medicine | Admitting: Emergency Medicine

## 2015-03-02 ENCOUNTER — Encounter (HOSPITAL_COMMUNITY): Payer: Self-pay | Admitting: Emergency Medicine

## 2015-03-02 DIAGNOSIS — Z91040 Latex allergy status: Secondary | ICD-10-CM | POA: Insufficient documentation

## 2015-03-02 DIAGNOSIS — Z79818 Long term (current) use of other agents affecting estrogen receptors and estrogen levels: Secondary | ICD-10-CM | POA: Insufficient documentation

## 2015-03-02 DIAGNOSIS — Z8781 Personal history of (healed) traumatic fracture: Secondary | ICD-10-CM | POA: Diagnosis not present

## 2015-03-02 DIAGNOSIS — R45851 Suicidal ideations: Secondary | ICD-10-CM | POA: Diagnosis present

## 2015-03-02 DIAGNOSIS — Z880 Allergy status to penicillin: Secondary | ICD-10-CM | POA: Diagnosis not present

## 2015-03-02 DIAGNOSIS — K088 Other specified disorders of teeth and supporting structures: Secondary | ICD-10-CM | POA: Diagnosis not present

## 2015-03-02 DIAGNOSIS — Z8679 Personal history of other diseases of the circulatory system: Secondary | ICD-10-CM | POA: Insufficient documentation

## 2015-03-02 DIAGNOSIS — M069 Rheumatoid arthritis, unspecified: Secondary | ICD-10-CM | POA: Diagnosis not present

## 2015-03-02 DIAGNOSIS — F419 Anxiety disorder, unspecified: Secondary | ICD-10-CM | POA: Diagnosis not present

## 2015-03-02 DIAGNOSIS — Z720 Tobacco use: Secondary | ICD-10-CM | POA: Insufficient documentation

## 2015-03-02 DIAGNOSIS — J45909 Unspecified asthma, uncomplicated: Secondary | ICD-10-CM | POA: Diagnosis not present

## 2015-03-02 DIAGNOSIS — F1210 Cannabis abuse, uncomplicated: Secondary | ICD-10-CM | POA: Insufficient documentation

## 2015-03-02 DIAGNOSIS — F329 Major depressive disorder, single episode, unspecified: Secondary | ICD-10-CM | POA: Diagnosis not present

## 2015-03-02 DIAGNOSIS — G8929 Other chronic pain: Secondary | ICD-10-CM | POA: Diagnosis not present

## 2015-03-02 DIAGNOSIS — F32A Depression, unspecified: Secondary | ICD-10-CM

## 2015-03-02 LAB — COMPREHENSIVE METABOLIC PANEL
ALBUMIN: 4 g/dL (ref 3.5–5.0)
ALK PHOS: 43 U/L (ref 38–126)
ALT: 13 U/L — AB (ref 14–54)
AST: 15 U/L (ref 15–41)
Anion gap: 5 (ref 5–15)
BUN: 11 mg/dL (ref 6–20)
CHLORIDE: 109 mmol/L (ref 101–111)
CO2: 23 mmol/L (ref 22–32)
Calcium: 8.8 mg/dL — ABNORMAL LOW (ref 8.9–10.3)
Creatinine, Ser: 0.65 mg/dL (ref 0.44–1.00)
GFR calc Af Amer: >60 mL/min (ref >60)
GFR calc non Af Amer: >60 mL/min (ref >60)
Glucose, Bld: 100 mg/dL — ABNORMAL HIGH (ref 65–99)
Potassium: 4.1 mmol/L (ref 3.5–5.1)
SODIUM: 137 mmol/L (ref 135–145)
TOTAL PROTEIN: 7.2 g/dL (ref 6.5–8.1)
Total Bilirubin: 0.5 mg/dL (ref 0.3–1.2)

## 2015-03-02 LAB — RAPID URINE DRUG SCREEN, HOSP PERFORMED
Amphetamines: NOT DETECTED
BARBITURATES: NOT DETECTED
Benzodiazepines: NOT DETECTED
Cocaine: NOT DETECTED
OPIATES: NOT DETECTED
Tetrahydrocannabinol: POSITIVE — AB

## 2015-03-02 LAB — ETHANOL

## 2015-03-02 LAB — CBC
HCT: 36.8 % (ref 36.0–46.0)
Hemoglobin: 12.4 g/dL (ref 12.0–15.0)
MCH: 32 pg (ref 26.0–34.0)
MCHC: 33.7 g/dL (ref 30.0–36.0)
MCV: 95.1 fL (ref 78.0–100.0)
Platelets: 261 10*3/uL (ref 150–400)
RBC: 3.87 MIL/uL (ref 3.87–5.11)
RDW: 12.9 % (ref 11.5–15.5)
WBC: 8.9 10*3/uL (ref 4.0–10.5)

## 2015-03-02 LAB — ACETAMINOPHEN LEVEL: Acetaminophen (Tylenol), Serum: <10 ug/mL — ABNORMAL LOW (ref 10–30)

## 2015-03-02 LAB — SALICYLATE LEVEL: Salicylate Lvl: <4 mg/dL (ref 2.8–30.0)

## 2015-03-02 MED ORDER — HYDROXYZINE HCL 25 MG PO TABS: 25.0000 mg | ORAL_TABLET | Freq: Four times a day (QID) | ORAL | Status: DC | PRN

## 2015-03-02 NOTE — ED Notes (Signed)
Patients family was given patient belongings to take home.

## 2015-03-02 NOTE — Discharge Instructions (Signed)
Follow up for out patient evaluation and counseling at [facility].    Depression Depression refers to feeling sad, low, down in the dumps, blue, gloomy, or empty. In general, there are two kinds of depression:  Normal sadness or normal grief. This kind of depression is one that we all feel from time to time after upsetting life experiences, such as the loss of a job or the ending of a relationship. This kind of depression is considered normal, is short lived, and resolves within a few days to 2 weeks. Depression experienced after the loss of a loved one (bereavement) often lasts longer than 2 weeks but normally gets better with time.  Clinical depression. This kind of depression lasts longer than normal sadness or normal grief or interferes with your ability to function at home, at work, and in school. It also interferes with your personal relationships. It affects almost every aspect of your life. Clinical depression is an illness. Symptoms of depression can also be caused by conditions other than those mentioned above, such as:  Physical illness. Some physical illnesses, including underactive thyroid gland (hypothyroidism), severe anemia, specific types of cancer, diabetes, uncontrolled seizures, heart and lung problems, strokes, and chronic pain are commonly associated with symptoms of depression.  Side effects of some prescription medicine. In some people, certain types of medicine can cause symptoms of depression.  Substance abuse. Abuse of alcohol and illicit drugs can cause symptoms of depression. SYMPTOMS Symptoms of normal sadness and normal grief include the following:  Feeling sad or crying for short periods of time.  Not caring about anything (apathy).  Difficulty sleeping or sleeping too much.  No longer able to enjoy the things you used to enjoy.  Desire to be by oneself all the time (social isolation).  Lack of energy or motivation.  Difficulty concentrating or  remembering.  Change in appetite or weight.  Restlessness or agitation. Symptoms of clinical depression include the same symptoms of normal sadness or normal grief and also the following symptoms:  Feeling sad or crying all the time.  Feelings of guilt or worthlessness.  Feelings of hopelessness or helplessness.  Thoughts of suicide or the desire to harm yourself (suicidal ideation).  Loss of touch with reality (psychotic symptoms). Seeing or hearing things that are not real (hallucinations) or having false beliefs about your life or the people around you (delusions and paranoia). DIAGNOSIS  The diagnosis of clinical depression is usually based on how bad the symptoms are and how long they have lasted. Your health care provider will also ask you questions about your medical history and substance use to find out if physical illness, use of prescription medicine, or substance abuse is causing your depression. Your health care provider may also order blood tests. TREATMENT  Often, normal sadness and normal grief do not require treatment. However, sometimes antidepressant medicine is given for bereavement to ease the depressive symptoms until they resolve. The treatment for clinical depression depends on how bad the symptoms are but often includes antidepressant medicine, counseling with a mental health professional, or both. Your health care provider will help to determine what treatment is best for you. Depression caused by physical illness usually goes away with appropriate medical treatment of the illness. If prescription medicine is causing depression, talk with your health care provider about stopping the medicine, decreasing the dose, or changing to another medicine. Depression caused by the abuse of alcohol or illicit drugs goes away when you stop using these substances. Some adults need  professional help in order to stop drinking or using drugs. SEEK IMMEDIATE MEDICAL CARE IF:  You have  thoughts about hurting yourself or others.  You lose touch with reality (have psychotic symptoms).  You are taking medicine for depression and have a serious side effect. FOR MORE INFORMATION  National Alliance on Mental Illness: www.nami.[employer] of Mental Health: https://carter.com/ Document Released: 07/24/2000 Document Revised: 12/11/2013 Document Reviewed: 10/26/2011 [facility] Patient Information 2015 [place], [place]. This information is not intended to replace advice given to you by your health care provider. Make sure you discuss any questions you have with your health care provider.  Panic Attacks Panic attacks are sudden, short feelings of great fear or discomfort. You may have them for no reason when you are relaxed, when you are uneasy (anxious), or when you are sleeping.  HOME CARE  Take all your medicines as told.  Check with your doctor before starting new medicines.  Keep all doctor visits. GET HELP IF:  You are not able to take your medicines as told.  Your symptoms do not get better.  Your symptoms get worse. GET HELP RIGHT AWAY IF:  Your attacks seem different than your normal attacks.  You have thoughts about hurting yourself or others.  You take panic attack medicine and you have a side effect. MAKE SURE YOU:  Understand these instructions.  Will watch your condition.  Will get help right away if you are not doing well or get worse. Document Released: 08/29/2010 Document Revised: 05/17/2013 Document Reviewed: 03/10/2013 [facility] Patient Information 2015 [place], [place]. This information is not intended to replace advice given to you by your health care provider. Make sure you discuss any questions you have with your health care provider.
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
## 2015-03-02 NOTE — BH Assessment (Signed)
[place] Assessment Progress Note Spoke with Dr. Jeneen Rinks and took history of pt. Requested that machine be put in the room by ED staff. Assessment should begin shortly.
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
## 2015-03-07 NOTE — Telephone Encounter (Signed)
According to patient record she was seen at ED 7/23.

## 2015-03-20 ENCOUNTER — Ambulatory Visit: Payer: 59 | Admitting: Certified Nurse Midwife

## 2015-05-13 IMAGING — CR DG CHEST 1V PORT
1 series · 1 of 1 positions shown · non-contrast
Comparison: 09/09/2014

CLINICAL DATA: Sore throat, chest pain, hematemesis for 2 days

EXAM:
PORTABLE CHEST - 1 VIEW

[AP]
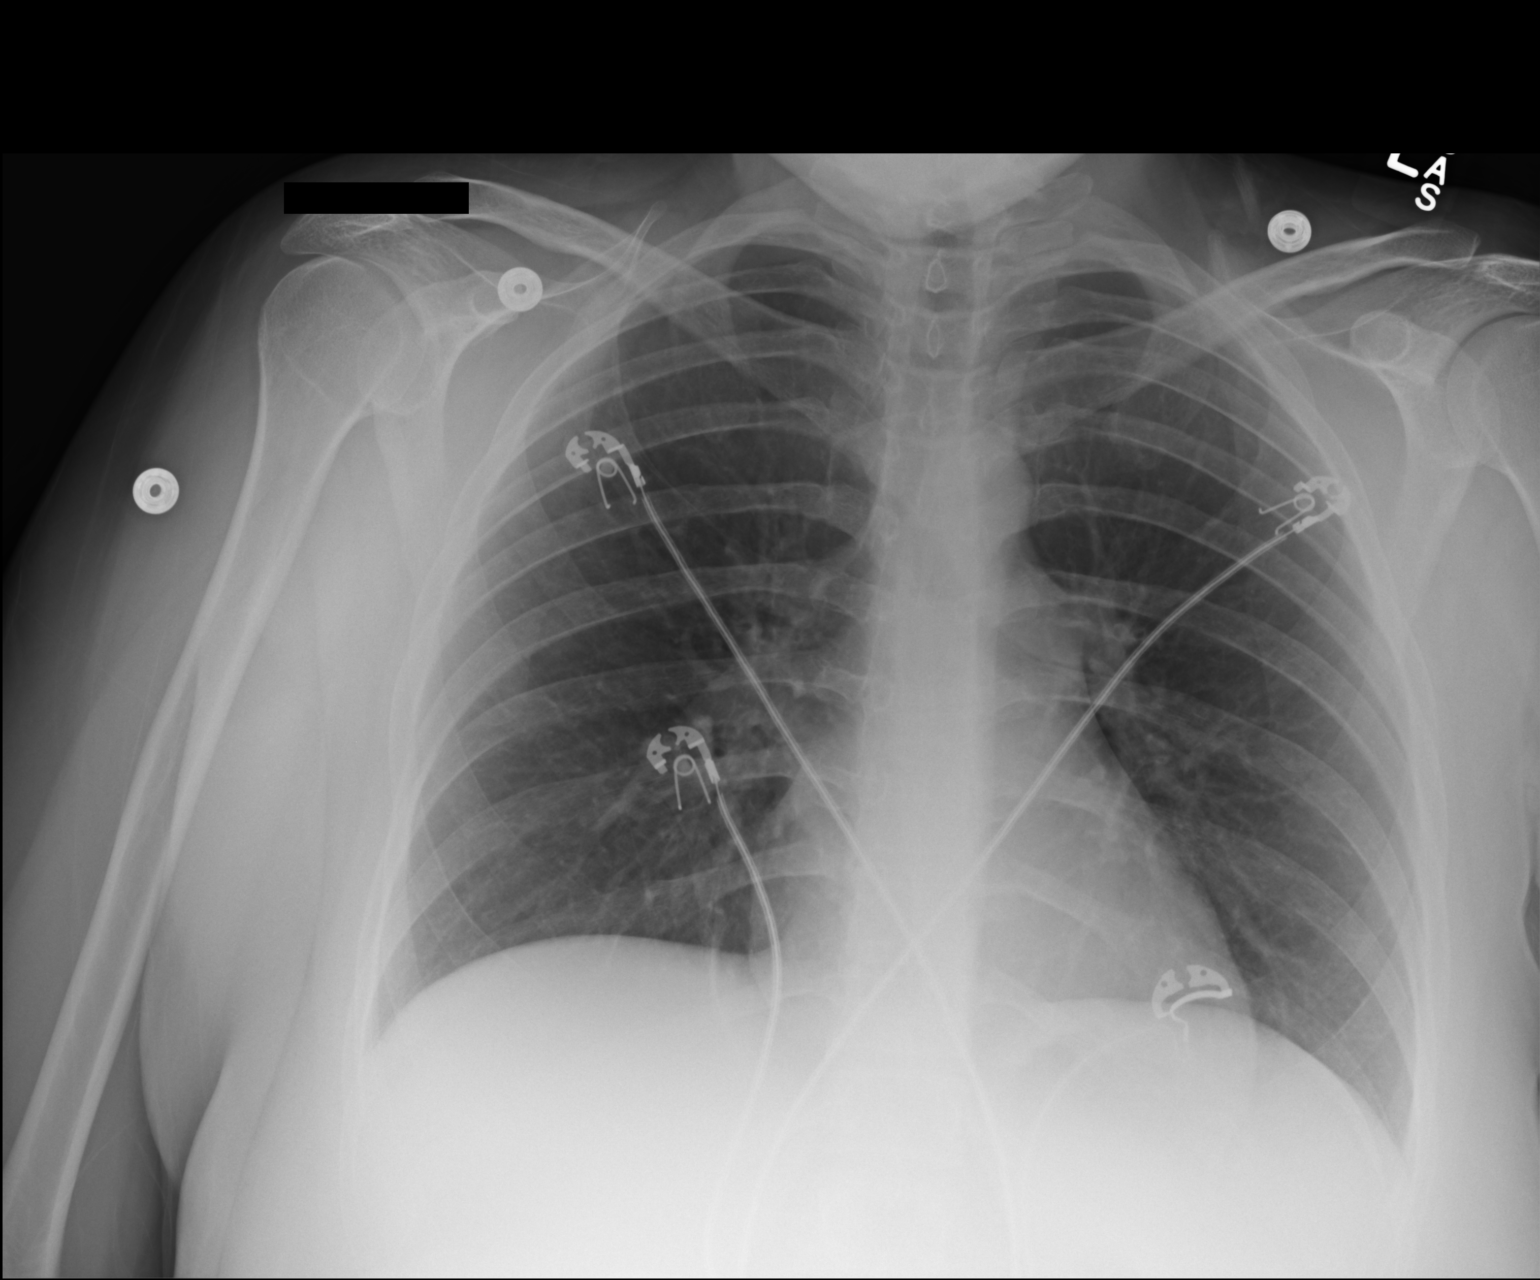

[1 of 1 positions shown; findings below may reference images not displayed]

FINDINGS: The heart size and mediastinal contours are within normal limits.
Both lungs are clear. The visualized skeletal structures are
unremarkable.
IMPRESSION: No active disease.

## 2015-06-26 ENCOUNTER — Inpatient Hospital Stay (HOSPITAL_COMMUNITY)
Admission: AD | Admit: 2015-06-26 | Discharge: 2015-06-26 | Disposition: A | Discharge status: Home / Self Care | Payer: Medicaid Other | Source: Ambulatory Visit | Attending: Obstetrics | Admitting: Obstetrics

## 2015-06-26 ENCOUNTER — Encounter (HOSPITAL_COMMUNITY): Payer: Self-pay | Admitting: Medical

## 2015-06-26 ENCOUNTER — Inpatient Hospital Stay (HOSPITAL_COMMUNITY): Payer: Medicaid Other

## 2015-06-26 DIAGNOSIS — O26899 Other specified pregnancy related conditions, unspecified trimester: Secondary | ICD-10-CM

## 2015-06-26 DIAGNOSIS — Z880 Allergy status to penicillin: Secondary | ICD-10-CM | POA: Diagnosis not present

## 2015-06-26 DIAGNOSIS — Z3A01 Less than 8 weeks gestation of pregnancy: Secondary | ICD-10-CM | POA: Diagnosis not present

## 2015-06-26 DIAGNOSIS — R109 Unspecified abdominal pain: Secondary | ICD-10-CM | POA: Diagnosis not present

## 2015-06-26 DIAGNOSIS — O26891 Other specified pregnancy related conditions, first trimester: Secondary | ICD-10-CM | POA: Diagnosis not present

## 2015-06-26 DIAGNOSIS — R1030 Lower abdominal pain, unspecified: Secondary | ICD-10-CM | POA: Insufficient documentation

## 2015-06-26 DIAGNOSIS — O9989 Other specified diseases and conditions complicating pregnancy, childbirth and the puerperium: Secondary | ICD-10-CM | POA: Diagnosis not present

## 2015-06-26 LAB — CBC WITH DIFFERENTIAL/PLATELET
BASOS PCT: 0 %
Basophils Absolute: 0 10*3/uL (ref 0.0–0.1)
Eosinophils Absolute: 0.1 10*3/uL (ref 0.0–0.7)
Eosinophils Relative: 1 %
HEMATOCRIT: 35.5 % — AB (ref 36.0–46.0)
Hemoglobin: 11.9 g/dL — ABNORMAL LOW (ref 12.0–15.0)
LYMPHS ABS: 4.1 10*3/uL — AB (ref 0.7–4.0)
Lymphocytes Relative: 42 %
MCH: 31.6 pg (ref 26.0–34.0)
MCHC: 33.5 g/dL (ref 30.0–36.0)
MCV: 94.2 fL (ref 78.0–100.0)
MONO ABS: 0.6 10*3/uL (ref 0.1–1.0)
MONOS PCT: 6 %
NEUTROS ABS: 4.9 10*3/uL (ref 1.7–7.7)
Neutrophils Relative %: 51 %
Platelets: 271 10*3/uL (ref 150–400)
RBC: 3.77 MIL/uL — ABNORMAL LOW (ref 3.87–5.11)
RDW: 13.1 % (ref 11.5–15.5)
WBC: 9.7 10*3/uL (ref 4.0–10.5)

## 2015-06-26 LAB — URINALYSIS, ROUTINE W REFLEX MICROSCOPIC
Bilirubin Urine: NEGATIVE
Glucose, UA: NEGATIVE mg/dL
Hgb urine dipstick: NEGATIVE
KETONES UR: 15 mg/dL — AB
LEUKOCYTES UA: NEGATIVE
NITRITE: NEGATIVE
PROTEIN: NEGATIVE mg/dL
Specific Gravity, Urine: 1.02 (ref 1.005–1.030)
pH: 7.5 (ref 5.0–8.0)

## 2015-06-26 LAB — WET PREP, GENITAL
SPERM: NONE SEEN
Trich, Wet Prep: NONE SEEN
Yeast Wet Prep HPF POC: NONE SEEN

## 2015-06-26 LAB — POCT PREGNANCY, URINE: Preg Test, Ur: POSITIVE — AB

## 2015-06-26 LAB — HCG, QUANTITATIVE, PREGNANCY: hCG, Beta Chain, Quant, S: 11636 m[IU]/mL — ABNORMAL HIGH (ref <5)
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
## 2015-06-26 NOTE — Discharge Instructions (Signed)
°  Prenatal Care Providers °[facility]    [facility] ° Phone- [phone]     Phone: [phone] °         °[facility]                      [facility] ° @[place]     Phone: [phone] ° Phone: [phone] °        [facility] °[facility]     Phone: [phone] ° Phone: [phone]   °        [facility] °[facility] [place]                hone: [phone] ° Phone: [phone] °        [facility] °Dr. Bernard Marshall      Phone: [phone] ° Phone: [phone] °         [facility] °[facility]                Phone: [phone] ° [facility]’s [facility]  ° Phone:[phone]    [facility] °         Phone: [phone] °[facility] °  Phone: [phone] °Safe Medications in Pregnancy  ° °Acne: °Benzoyl Peroxide °Salicylic Acid ° °Backache/Headache: °Tylenol: 2 regular strength every 4 hours OR °             2 Extra strength every 6 hours ° °Colds/Coughs/Allergies: °Benadryl (alcohol free) 25 mg every 6 hours as needed °Breath right strips °Claritin °Cepacol throat lozenges °Chloraseptic throat spray °Cold-Eeze- up to three times per day °Cough drops, alcohol free °Flonase (by prescription only) °Guaifenesin °Mucinex °Robitussin DM (plain only, alcohol free) °Saline nasal spray/drops °Sudafed (pseudoephedrine) & Actifed ** use only after [redacted] weeks gestation and if you do not have high blood pressure °Tylenol °Vicks Vaporub °Zinc lozenges °Zyrtec  ° °Constipation: °Colace °Ducolax suppositories °Fleet enema °Glycerin suppositories °Metamucil °Milk of magnesia °Miralax °Senokot °Smooth move tea ° °Diarrhea: °Kaopectate °Imodium A-D ° °*NO pepto Bismol ° °Hemorrhoids: °Anusol °Anusol HC °Preparation H °Tucks ° °Indigestion: °Tums °Maalox °Mylanta °Zantac  °Pepcid ° °Insomnia: °Benadryl (alcohol free) 25mg every 6 hours as needed °Tylenol  PM °Unisom, no Gelcaps ° °Leg Cramps: °Tums °MagGel ° °Nausea/Vomiting:  °Bonine °Dramamine °Emetrol °Ginger extract °Sea bands °Meclizine  °Nausea medication to take during pregnancy:  °Unisom (doxylamine succinate 25 mg tablets) Take one tablet daily at bedtime. If symptoms are not adequately controlled, the dose can be increased to a maximum recommended dose of two tablets daily (1/2 tablet in the morning, 1/2 tablet mid-afternoon and one at bedtime). °Vitamin B6 100mg tablets. Take one tablet twice a day (up to 200 mg per day). ° °Skin Rashes: °Aveeno products °Benadryl cream or 25mg every 6 hours as needed °Calamine Lotion °1% cortisone cream ° °Yeast infection: °Gyne-lotrimin 7 °Monistat 7 ° ° °**If taking multiple medications, please check labels to avoid duplicating the same active ingredients °**take medication as directed on the label °** Do not exceed 4000 mg of tylenol in 24 hours °**Do not take medications that contain aspirin or ibuprofen ° ° ° ° °
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
## 2015-06-27 LAB — ABO/RH: ABO/RH(D): A POS

## 2015-06-27 LAB — RPR: RPR Ser Ql: NONREACTIVE

## 2015-06-27 LAB — GC/CHLAMYDIA PROBE AMP (~~LOC~~) NOT AT ARMC
CHLAMYDIA, DNA PROBE: NEGATIVE
Neisseria Gonorrhea: NEGATIVE

## 2015-06-27 LAB — HIV ANTIBODY (ROUTINE TESTING W REFLEX): HIV SCREEN 4TH GENERATION: NONREACTIVE

## 2015-07-15 ENCOUNTER — Other Ambulatory Visit: Payer: Self-pay | Admitting: Certified Nurse Midwife

## 2015-07-30 ENCOUNTER — Ambulatory Visit (INDEPENDENT_AMBULATORY_CARE_PROVIDER_SITE_OTHER): Payer: Medicaid Other | Admitting: Obstetrics

## 2015-07-30 ENCOUNTER — Encounter: Payer: Self-pay | Admitting: Obstetrics

## 2015-07-30 VITALS — BP 109/57 | HR 60 | Temp 98.0°F | Wt 163.0 lb

## 2015-07-30 DIAGNOSIS — Z3481 Encounter for supervision of other normal pregnancy, first trimester: Secondary | ICD-10-CM

## 2015-07-30 DIAGNOSIS — K029 Dental caries, unspecified: Secondary | ICD-10-CM

## 2015-07-30 DIAGNOSIS — K047 Periapical abscess without sinus: Secondary | ICD-10-CM

## 2015-07-30 LAB — POCT URINALYSIS DIPSTICK
BILIRUBIN UA: NEGATIVE
Glucose, UA: NEGATIVE
KETONES UA: NEGATIVE
LEUKOCYTES UA: NEGATIVE
Nitrite, UA: NEGATIVE
PH UA: 6.5
PROTEIN UA: NEGATIVE
RBC UA: NEGATIVE
SPEC GRAV UA: 1.015
Urobilinogen, UA: NEGATIVE

## 2015-07-30 MED ORDER — CLINDAMYCIN HCL 300 MG PO CAPS: 300.0000 mg | ORAL_CAPSULE | Freq: Three times a day (TID) | ORAL | Status: DC

## 2015-07-30 MED ORDER — OXYCODONE-ACETAMINOPHEN 10-325 MG PO TABS
1.0000 | ORAL_TABLET | ORAL | Status: DC | PRN
Start: 2015-07-30 — End: 2015-08-22
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
## 2015-07-31 LAB — OBSTETRIC PANEL
Antibody Screen: NEGATIVE
BASOS PCT: 0 % (ref 0–1)
Basophils Absolute: 0 10*3/uL (ref 0.0–0.1)
Eosinophils Absolute: 0.1 10*3/uL (ref 0.0–0.7)
Eosinophils Relative: 1 % (ref 0–5)
HEMATOCRIT: 34.6 % — AB (ref 36.0–46.0)
HEP B S AG: NEGATIVE
Hemoglobin: 11.7 g/dL — ABNORMAL LOW (ref 12.0–15.0)
LYMPHS ABS: 2.9 10*3/uL (ref 0.7–4.0)
LYMPHS PCT: 30 % (ref 12–46)
MCH: 32.1 pg (ref 26.0–34.0)
MCHC: 33.8 g/dL (ref 30.0–36.0)
MCV: 94.8 fL (ref 78.0–100.0)
MONO ABS: 0.6 10*3/uL (ref 0.1–1.0)
MONOS PCT: 6 % (ref 3–12)
MPV: 10.3 fL (ref 8.6–12.4)
NEUTROS ABS: 6 10*3/uL (ref 1.7–7.7)
NEUTROS PCT: 63 % (ref 43–77)
PLATELETS: 267 10*3/uL (ref 150–400)
RBC: 3.65 MIL/uL — ABNORMAL LOW (ref 3.87–5.11)
RDW: 12.9 % (ref 11.5–15.5)
RUBELLA: 0.8 {index} (ref <0.90)
Rh Type: POSITIVE
WBC: 9.5 10*3/uL (ref 4.0–10.5)

## 2015-07-31 LAB — CULTURE, OB URINE: Colony Count: 10000

## 2015-07-31 LAB — VARICELLA ZOSTER ANTIBODY, IGG: VARICELLA IGG: 247.9 {index} — AB (ref <135.00)

## 2015-07-31 LAB — TSH: TSH: 0.601 u[IU]/mL (ref 0.350–4.500)

## 2015-07-31 LAB — VITAMIN D 25 HYDROXY (VIT D DEFICIENCY, FRACTURES): Vit D, 25-Hydroxy: 15 ng/mL — ABNORMAL LOW (ref 30–100)

## 2015-07-31 LAB — HIV ANTIBODY (ROUTINE TESTING W REFLEX): HIV 1&2 Ab, 4th Generation: NONREACTIVE

## 2015-08-01 LAB — HEMOGLOBINOPATHY EVALUATION
HEMOGLOBIN OTHER: 0 %
HGB A2 QUANT: 2.6 % (ref 2.2–3.2)
HGB A: 97.4 % (ref 96.8–97.8)
Hgb F Quant: 0 % (ref 0.0–2.0)
Hgb S Quant: 0 %

## 2015-08-14 ENCOUNTER — Telehealth: Payer: Self-pay | Admitting: *Deleted

## 2015-08-14 NOTE — Telephone Encounter (Signed)
Recommend Tylenol.

## 2015-08-14 NOTE — Telephone Encounter (Signed)
Patient notified- advised to call the office daily for cancellation or if she gets worse- go to hospital.

## 2015-08-14 NOTE — Telephone Encounter (Signed)
Patient states she has a dental appointment 1/19 and her face is hurting. The dentist office told her she would have to get her pain medication from her OB. Last pain medication was given 07/30/2015.

## 2015-08-22 ENCOUNTER — Encounter: Payer: Self-pay | Admitting: Obstetrics

## 2015-08-22 ENCOUNTER — Ambulatory Visit (INDEPENDENT_AMBULATORY_CARE_PROVIDER_SITE_OTHER): Payer: Medicaid Other | Admitting: Obstetrics

## 2015-08-22 VITALS — BP 109/77 | HR 88 | Wt 160.0 lb

## 2015-08-22 DIAGNOSIS — O99342 Other mental disorders complicating pregnancy, second trimester: Secondary | ICD-10-CM

## 2015-08-22 DIAGNOSIS — F329 Major depressive disorder, single episode, unspecified: Secondary | ICD-10-CM

## 2015-08-22 DIAGNOSIS — Z3482 Encounter for supervision of other normal pregnancy, second trimester: Secondary | ICD-10-CM

## 2015-08-22 DIAGNOSIS — F32A Depression, unspecified: Secondary | ICD-10-CM

## 2015-08-22 DIAGNOSIS — K029 Dental caries, unspecified: Secondary | ICD-10-CM

## 2015-08-22 MED ORDER — CITALOPRAM HYDROBROMIDE 20 MG PO TABS: 20.0000 mg | ORAL_TABLET | Freq: Every day | ORAL | Status: DC

## 2015-08-22 MED ORDER — OXYCODONE-ACETAMINOPHEN 10-325 MG PO TABS: 1.0000 | ORAL_TABLET | ORAL | Status: DC | PRN

## 2015-08-22 NOTE — Progress Notes (Signed)
  Subjective:    [name] is a 25 y.o. female being seen today for her obstetrical visit. She is at [redacted]w[redacted]d gestation. Patient reports: backache.  Problem List Items Addressed This Visit    None    Visit Diagnoses    Encounter for supervision of other normal pregnancy in second trimester    -  Primary    Relevant Orders    POCT urinalysis dipstick    Pain due to dental caries        Relevant Medications    oxyCODONE-acetaminophen (PERCOCET) 10-325 MG tablet    Depression complicating pregnancy, antepartum, second trimester        Relevant Medications    citalopram (CELEXA) 20 MG tablet      Patient Active Problem List   Diagnosis Date Noted  . Bacteremia   . PID (acute pelvic inflammatory disease) 11/20/2014  . Migraine headache with aura 11/20/2014  . Trichomonal cervicitis 11/20/2014  . Nausea and vomiting 11/19/2014  . Strep pharyngitis 11/19/2014  . SOB (shortness of breath) 02/17/2011  . Bruises easily/rash 02/17/2011  . Nausea 02/17/2011  . Poor appetite 02/17/2011  . Abdominal pain 02/17/2011  . Joint pain 02/17/2011    Objective:     BP 109/77 mmHg  Pulse 88  Wt 160 lb (72.576 kg)  LMP 04/23/2015 (Approximate) Uterine Size: Below umbilicus     Assessment:    Pregnancy @ [redacted]w[redacted]d  weeks Doing well    Plan:    Problem list reviewed and updated. Labs reviewed.  Follow up in 4 weeks. FIRST/CF mutation testing/NIPT/QUAD SCREEN/fragile X/Ashkenazi Jewish population testing/Spinal muscular atrophy discussed: requested. Role of ultrasound in pregnancy discussed; fetal survey: requested. Amniocentesis discussed: not indicated.

## 2015-08-27 ENCOUNTER — Encounter: Payer: 59 | Admitting: Obstetrics

## 2015-09-02 ENCOUNTER — Encounter (HOSPITAL_COMMUNITY): Payer: Self-pay | Admitting: *Deleted

## 2015-09-02 ENCOUNTER — Inpatient Hospital Stay (HOSPITAL_COMMUNITY)
Admission: AD | Admit: 2015-09-02 | Discharge: 2015-09-02 | Disposition: A | Discharge status: Home / Self Care | Payer: Medicaid Other | Source: Ambulatory Visit | Attending: Obstetrics | Admitting: Obstetrics

## 2015-09-02 DIAGNOSIS — L02235 Carbuncle of perineum: Secondary | ICD-10-CM | POA: Diagnosis not present

## 2015-09-02 DIAGNOSIS — Z880 Allergy status to penicillin: Secondary | ICD-10-CM | POA: Insufficient documentation

## 2015-09-02 DIAGNOSIS — R109 Unspecified abdominal pain: Secondary | ICD-10-CM | POA: Insufficient documentation

## 2015-09-02 DIAGNOSIS — Z3A15 15 weeks gestation of pregnancy: Secondary | ICD-10-CM | POA: Diagnosis not present

## 2015-09-02 DIAGNOSIS — O9989 Other specified diseases and conditions complicating pregnancy, childbirth and the puerperium: Secondary | ICD-10-CM | POA: Diagnosis not present

## 2015-09-02 DIAGNOSIS — O26892 Other specified pregnancy related conditions, second trimester: Secondary | ICD-10-CM | POA: Insufficient documentation

## 2015-09-02 DIAGNOSIS — O26899 Other specified pregnancy related conditions, unspecified trimester: Secondary | ICD-10-CM

## 2015-09-02 LAB — URINALYSIS, ROUTINE W REFLEX MICROSCOPIC
Bilirubin Urine: NEGATIVE
GLUCOSE, UA: NEGATIVE mg/dL
Hgb urine dipstick: NEGATIVE
Ketones, ur: 15 mg/dL — AB
LEUKOCYTES UA: NEGATIVE
Nitrite: NEGATIVE
PH: 7 (ref 5.0–8.0)
Protein, ur: NEGATIVE mg/dL
Specific Gravity, Urine: 1.015 (ref 1.005–1.030)

## 2015-09-02 NOTE — MAU Note (Signed)
Had some spotting on Sat afternoon and again Sun afternoon.  Been having pains in lower abd, very, very sharp.  Woke up Thurs morning, noted a cut on labia, used  Antibiotic ointment, noted a ? Hair bump

## 2015-09-02 NOTE — Discharge Instructions (Signed)
Dolor abdominal en el embarazo  (Abdominal Pain During Pregnancy)  El dolor abdominal es frecuente durante el embarazo. Generalmente no causa ningún daño. El dolor abdominal puede tener numerosas causas. Algunas causas son más graves que otras. Ciertas causas de dolor abdominal durante el embarazo se diagnostican fácilmente. A veces, se tarda un tiempo para llegar al diagnóstico. Otras veces la causa no se conoce. El dolor abdominal puede estar relacionado con alguna alteración del embarazo, o puede deberse a una causa totalmente diferente. Por este motivo, siempre consulte a su médico cuando sienta molestias abdominales.  INSTRUCCIONES PARA EL CUIDADO EN EL HOGAR   Esté atenta al dolor para ver si hay cambios. Las siguientes indicaciones ayudarán a aliviar cualquier molestia que pueda sentir:  · No tenga relaciones sexuales y no coloque nada dentro de la vagina hasta que los síntomas hayan desaparecido completamente.  · Descanse todo lo que pueda hasta que el dolor se le haya calmado.  · Si siente náuseas, beba líquidos claros. Evite los alimentos sólidos mientras sienta malestar o tenga náuseas.  · Tome sólo medicamentos de venta libre o recetados, según las indicaciones del médico.  · Cumpla con todas las visitas de control, según le indique su médico.  SOLICITE ATENCIÓN MÉDICA DE INMEDIATO SI:  · Tiene un sangrado, pérdida de líquidos o elimina tejidos por la vagina.  · El dolor o los cólicos aumentan.  · Tiene vómitos persistentes.  · Comienza a sentir dolor al orinar u observa sangre.  · Tiene fiebre.  · Nota que los movimientos del bebé disminuyen.  · Siente intensa debilidad o se marea.  · Tiene dificultad para respirar con o sin dolor abdominal.  · Siente un dolor de cabeza intenso junto al dolor abdominal.  · Tiene una secreción vaginal anormal con dolor abdominal.  · Tiene diarrea persistente.  · El dolor abdominal sigue o empeora aún después de hacer reposo.  ASEGÚRESE DE QUE:   · Comprende estas  instrucciones.  · Controlará su afección.  · Recibirá ayuda de inmediato si no mejora o si empeora.     Esta información no tiene como fin reemplazar el consejo del médico. Asegúrese de hacerle al médico cualquier pregunta que tenga.     Document Released: 07/27/2005 Document Revised: 05/17/2013  Elsevier Interactive Patient Education ©2016 Elsevier Inc.
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
## 2015-09-04 ENCOUNTER — Encounter: Payer: Self-pay | Admitting: Obstetrics

## 2015-09-04 ENCOUNTER — Emergency Department (HOSPITAL_COMMUNITY)
Admission: EM | Admit: 2015-09-04 | Discharge: 2015-09-04 | Disposition: A | Discharge status: Home / Self Care | Payer: Medicaid Other | Attending: Emergency Medicine | Admitting: Emergency Medicine

## 2015-09-04 ENCOUNTER — Emergency Department (HOSPITAL_COMMUNITY): Payer: Medicaid Other

## 2015-09-04 ENCOUNTER — Encounter (HOSPITAL_COMMUNITY): Payer: Self-pay | Admitting: Emergency Medicine

## 2015-09-04 ENCOUNTER — Other Ambulatory Visit: Payer: Self-pay | Admitting: Certified Nurse Midwife

## 2015-09-04 ENCOUNTER — Ambulatory Visit (INDEPENDENT_AMBULATORY_CARE_PROVIDER_SITE_OTHER): Payer: Medicaid Other | Admitting: Obstetrics

## 2015-09-04 VITALS — BP 131/81 | HR 91 | Wt 159.0 lb

## 2015-09-04 DIAGNOSIS — O26899 Other specified pregnancy related conditions, unspecified trimester: Secondary | ICD-10-CM

## 2015-09-04 DIAGNOSIS — Z8659 Personal history of other mental and behavioral disorders: Secondary | ICD-10-CM | POA: Insufficient documentation

## 2015-09-04 DIAGNOSIS — O9989 Other specified diseases and conditions complicating pregnancy, childbirth and the puerperium: Secondary | ICD-10-CM | POA: Diagnosis present

## 2015-09-04 DIAGNOSIS — R109 Unspecified abdominal pain: Secondary | ICD-10-CM

## 2015-09-04 DIAGNOSIS — Z91040 Latex allergy status: Secondary | ICD-10-CM | POA: Insufficient documentation

## 2015-09-04 DIAGNOSIS — Z8739 Personal history of other diseases of the musculoskeletal system and connective tissue: Secondary | ICD-10-CM | POA: Insufficient documentation

## 2015-09-04 DIAGNOSIS — R1031 Right lower quadrant pain: Secondary | ICD-10-CM | POA: Diagnosis not present

## 2015-09-04 DIAGNOSIS — G8929 Other chronic pain: Secondary | ICD-10-CM | POA: Insufficient documentation

## 2015-09-04 DIAGNOSIS — O99512 Diseases of the respiratory system complicating pregnancy, second trimester: Secondary | ICD-10-CM | POA: Diagnosis not present

## 2015-09-04 DIAGNOSIS — O99352 Diseases of the nervous system complicating pregnancy, second trimester: Secondary | ICD-10-CM | POA: Insufficient documentation

## 2015-09-04 DIAGNOSIS — J45909 Unspecified asthma, uncomplicated: Secondary | ICD-10-CM | POA: Diagnosis not present

## 2015-09-04 DIAGNOSIS — Z8679 Personal history of other diseases of the circulatory system: Secondary | ICD-10-CM | POA: Insufficient documentation

## 2015-09-04 DIAGNOSIS — R1032 Left lower quadrant pain: Secondary | ICD-10-CM | POA: Insufficient documentation

## 2015-09-04 DIAGNOSIS — Z87891 Personal history of nicotine dependence: Secondary | ICD-10-CM | POA: Diagnosis not present

## 2015-09-04 DIAGNOSIS — Z3492 Encounter for supervision of normal pregnancy, unspecified, second trimester: Secondary | ICD-10-CM

## 2015-09-04 DIAGNOSIS — Z880 Allergy status to penicillin: Secondary | ICD-10-CM | POA: Insufficient documentation

## 2015-09-04 DIAGNOSIS — Z8781 Personal history of (healed) traumatic fracture: Secondary | ICD-10-CM | POA: Diagnosis not present

## 2015-09-04 DIAGNOSIS — Z3A16 16 weeks gestation of pregnancy: Secondary | ICD-10-CM | POA: Insufficient documentation

## 2015-09-04 LAB — URINALYSIS, ROUTINE W REFLEX MICROSCOPIC
Glucose, UA: NEGATIVE mg/dL
Hgb urine dipstick: NEGATIVE
Ketones, ur: >80 mg/dL — AB
Leukocytes, UA: NEGATIVE
NITRITE: NEGATIVE
Protein, ur: 30 mg/dL — AB
Specific Gravity, Urine: 1.028 (ref 1.005–1.030)
pH: 6.5 (ref 5.0–8.0)

## 2015-09-04 LAB — POCT URINALYSIS DIPSTICK
Bilirubin, UA: NEGATIVE
Glucose, UA: NEGATIVE
LEUKOCYTES UA: NEGATIVE
Nitrite, UA: NEGATIVE
PH UA: 8.5
PROTEIN UA: NEGATIVE
RBC UA: NEGATIVE
Spec Grav, UA: <=1.005
Urobilinogen, UA: NEGATIVE

## 2015-09-04 LAB — HEPATIC FUNCTION PANEL
ALT: 24 U/L (ref 14–54)
AST: 21 U/L (ref 15–41)
Albumin: 3.7 g/dL (ref 3.5–5.0)
Alkaline Phosphatase: 43 U/L (ref 38–126)
BILIRUBIN TOTAL: 0.2 mg/dL — AB (ref 0.3–1.2)
Total Protein: 7.4 g/dL (ref 6.5–8.1)

## 2015-09-04 LAB — CBC
HCT: 31.7 % — ABNORMAL LOW (ref 36.0–46.0)
HEMOGLOBIN: 10.9 g/dL — AB (ref 12.0–15.0)
MCH: 32.3 pg (ref 26.0–34.0)
MCHC: 34.4 g/dL (ref 30.0–36.0)
MCV: 94.1 fL (ref 78.0–100.0)
PLATELETS: 253 10*3/uL (ref 150–400)
RBC: 3.37 MIL/uL — ABNORMAL LOW (ref 3.87–5.11)
RDW: 12.7 % (ref 11.5–15.5)
WBC: 11.7 10*3/uL — ABNORMAL HIGH (ref 4.0–10.5)

## 2015-09-04 LAB — URINE MICROSCOPIC-ADD ON

## 2015-09-04 LAB — BASIC METABOLIC PANEL
Anion gap: 9 (ref 5–15)
BUN: <5 mg/dL — ABNORMAL LOW (ref 6–20)
CO2: 20 mmol/L — ABNORMAL LOW (ref 22–32)
CREATININE: 0.45 mg/dL (ref 0.44–1.00)
Calcium: 9.3 mg/dL (ref 8.9–10.3)
Chloride: 109 mmol/L (ref 101–111)
Glucose, Bld: 104 mg/dL — ABNORMAL HIGH (ref 65–99)
Potassium: 3.3 mmol/L — ABNORMAL LOW (ref 3.5–5.1)
SODIUM: 138 mmol/L (ref 135–145)

## 2015-09-04 LAB — HCG, QUANTITATIVE, PREGNANCY: HCG, BETA CHAIN, QUANT, S: 38181 m[IU]/mL — AB (ref <5)

## 2015-09-04 LAB — LIPASE, BLOOD: Lipase: 23 U/L (ref 11–51)

## 2015-09-04 MED ORDER — HYDROMORPHONE HCL 1 MG/ML IJ SOLN
1.0000 mg | Freq: Once | INTRAMUSCULAR | Status: AC
  Administered 2015-09-04: 1 mg via INTRAVENOUS
  Filled 2015-09-04: qty 1

## 2015-09-04 MED ORDER — SODIUM CHLORIDE 0.9 % IV BOLUS (SEPSIS)
500.0000 mL | Freq: Once | INTRAVENOUS | Status: AC
  Administered 2015-09-04: 500 mL via INTRAVENOUS

## 2015-09-04 MED ORDER — OXYCODONE HCL 5 MG PO TABS
5.0000 mg | ORAL_TABLET | Freq: Four times a day (QID) | ORAL | Status: DC | PRN
Start: 2015-09-04 — End: 2015-10-01

## 2015-09-04 MED ORDER — ONDANSETRON HCL 4 MG/2ML IJ SOLN
4.0000 mg | Freq: Once | INTRAMUSCULAR | Status: AC
  Administered 2015-09-04: 4 mg via INTRAVENOUS
  Filled 2015-09-04: qty 2

## 2015-09-04 NOTE — ED Notes (Signed)
[place] at bedside again

## 2015-09-04 NOTE — ED Notes (Signed)
Patient presents with LLQ abdominal pain radiating to left lower back and LUQ abdominal pain, diarrhea, vomiting, vaginal spotting. Patient is [redacted] weeks pregnant. Denies fever/chills, denies urinary symptoms.

## 2015-09-04 NOTE — ED Notes (Signed)
US at bedside
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
## 2015-09-04 NOTE — Progress Notes (Signed)
Pt is having severe constant abdominal pain.  Pt is having a hard time eating.

## 2015-09-04 NOTE — ED Notes (Addendum)
Pt reports L abd pain and burning sensation x 1 month.  Has been to [facility] a couple to times for this.  Was given an abd binder for support without any relief.  Pt is [redacted] weeks pregnant.

## 2015-09-04 NOTE — Discharge Instructions (Signed)
Please read and follow all provided instructions.  Your diagnoses today include:  1. Abdominal pain, unspecified abdominal location   2. LLQ pain     Tests performed today include:  Vital signs. See below for your results today.   Medications prescribed:   None   Home care instructions:  Follow any educational materials contained in this packet.  Follow-up instructions: Please follow-up with your OBGYN for further management of symptoms   Return instructions:   Please return to the Emergency Department if you do not get better, if you get worse, or new symptoms OR  - Fever (temperature greater than 101.66F)  - Bleeding that does not stop with holding pressure to the area    -Severe pain (please note that you may be more sore the day after your accident)  - Chest Pain  - Difficulty breathing  - Severe nausea or vomiting  - Inability to tolerate food and liquids  - Passing out  - Skin becoming red around your wounds  - Change in mental status (confusion or lethargy)  - New numbness or weakness     Please return if you have any other emergent concerns.  Additional Information:  Your vital signs today were: BP 143/78 mmHg   Pulse 77   Temp(Src) 97.6 F (36.4 C) (Oral)   Resp 19   SpO2 99%   LMP 04/23/2015 (Approximate) If your blood pressure (BP) was elevated above 135/85 this visit, please have this repeated by your doctor within one month. ---------------

## 2015-09-04 NOTE — Progress Notes (Signed)
  Subjective:    [name] is a 25 y.o. female being seen today for her obstetrical visit. She is at [redacted]w[redacted]d gestation. Patient reports: heartburn, nausea and severe abdominal pain and diarrhea x over past month.  Problem List Items Addressed This Visit    None    Visit Diagnoses    Prenatal care in second trimester    -  Primary    Relevant Orders    AFP, Quad Screen    POCT urinalysis dipstick (Completed)      Patient Active Problem List   Diagnosis Date Noted  . Bacteremia   . PID (acute pelvic inflammatory disease) 11/20/2014  . Migraine headache with aura 11/20/2014  . Trichomonal cervicitis 11/20/2014  . Nausea and vomiting 11/19/2014  . Strep pharyngitis 11/19/2014  . SOB (shortness of breath) 02/17/2011  . Bruises easily/rash 02/17/2011  . Nausea 02/17/2011  . Poor appetite 02/17/2011  . Abdominal pain 02/17/2011  . Joint pain 02/17/2011    Objective:     BP 131/81 mmHg  Pulse 91  Wt 159 lb (72.122 kg)  LMP 04/23/2015 (Approximate) Uterine Size: Below umbilicus     Assessment:    Pregnancy @ [redacted]w[redacted]d  weeks  Abdominal pain, nausea, vomiting and diarrhea  Plan:    Sent to [facility] for further evaluation   Problem list reviewed and updated. Labs reviewed.  Follow up in 4 weeks. FIRST/CF mutation testing/NIPT/QUAD SCREEN/fragile X/Ashkenazi Jewish population testing/Spinal muscular atrophy discussed: requested. Role of ultrasound in pregnancy discussed; fetal survey: requested. Amniocentesis discussed: not indicated.

## 2015-09-05 ENCOUNTER — Encounter: Payer: Medicaid Other | Admitting: Obstetrics

## 2015-09-05 ENCOUNTER — Encounter: Payer: Medicaid Other | Admitting: Certified Nurse Midwife

## 2015-09-05 LAB — AFP, QUAD SCREEN
AFP: 29.6 ng/mL
Age Alone: 1:1120 {titer}
Curr Gest Age: 15.5 wks.days
HCG, Total: 38.64 IU/mL
INH: 89.7 pg/mL
INTERPRETATION-AFP: NEGATIVE
MOM FOR AFP: 0.99
MoM for INH: 0.51
MoM for hCG: 0.91
OPEN SPINA BIFIDA: NEGATIVE
TRI 18 SCR RISK EST: NEGATIVE
UE3 VALUE: 0.85 ng/mL
uE3 Mom: 1.13

## 2015-09-06 LAB — URINE CULTURE: Culture: >=100000

## 2015-09-09 ENCOUNTER — Telehealth (HOSPITAL_COMMUNITY): Payer: Self-pay

## 2015-09-09 NOTE — Telephone Encounter (Signed)
Post ED Visit - Positive Culture Follow-up  Culture report reviewed by antimicrobial stewardship pharmacist:  []  Elenor Quinones, Pharm.D. []  Heide Guile, Pharm.D., BCPS [x]  Parks Neptune, Pharm.D. []  Alycia Rossetti, Pharm.D., BCPS []  Josephine, Pharm.D., BCPS, AAHIVP []  Legrand Como, Pharm.D., BCPS, AAHIVP []  Milus Glazier, Pharm.D. []  Rob [place], [place].D.  Positive urine culture, >/= 100,000 colonies -> Lactobacillus Species OK   Dortha Kern 09/09/2015, 4:15 AM

## 2015-09-10 ENCOUNTER — Telehealth: Payer: Self-pay | Admitting: *Deleted

## 2015-09-10 NOTE — Telephone Encounter (Signed)
Patient reports she was seen at [facility] and was given pain medication to last for a couple days. She still has no diagnosis ans she is still in pain. She states she is still being told the pain is from the scar tissue. She needs some relief- what is she to do?

## 2015-09-12 ENCOUNTER — Telehealth: Payer: Self-pay

## 2015-09-12 ENCOUNTER — Other Ambulatory Visit: Payer: Self-pay | Admitting: Certified Nurse Midwife

## 2015-09-12 ENCOUNTER — Other Ambulatory Visit: Payer: Self-pay | Admitting: Obstetrics

## 2015-09-12 DIAGNOSIS — R109 Unspecified abdominal pain: Principal | ICD-10-CM

## 2015-09-12 DIAGNOSIS — O26899 Other specified pregnancy related conditions, unspecified trimester: Secondary | ICD-10-CM

## 2015-09-12 DIAGNOSIS — O0972 Supervision of high risk pregnancy due to social problems, second trimester: Secondary | ICD-10-CM

## 2015-09-12 MED FILL — OXYCODONE/APAP 5-325: 5-325 | 3 days supply | Qty: 12 | Fill #0

## 2015-09-12 NOTE — Telephone Encounter (Signed)
[place] - MFM said they needed order for u/s along with consult referral - she said do as a MFM detail - Thanks!

## 2015-09-12 NOTE — Telephone Encounter (Signed)
patient has u/s and consult appt wtih [facility] on Friday 2/17 at 8:15am

## 2015-09-12 NOTE — Telephone Encounter (Signed)
Referred to MFM for evaluation and management recommendation for pain. Patient should continue Tylenol and supportive measures in the meantime. Would avoid chronic narcotic treatment of unknown cause of pain.

## 2015-09-13 NOTE — Telephone Encounter (Signed)
Spoke to patient and she is aware that we are working to try to figure out why she is having this pain.

## 2015-09-20 ENCOUNTER — Encounter (HOSPITAL_COMMUNITY): Payer: Self-pay | Admitting: Certified Nurse Midwife

## 2015-09-26 ENCOUNTER — Emergency Department (HOSPITAL_COMMUNITY)
Admission: EM | Admit: 2015-09-26 | Discharge: 2015-09-26 | Disposition: A | Discharge status: Home / Self Care | Payer: Medicaid Other | Attending: Emergency Medicine | Admitting: Emergency Medicine

## 2015-09-26 ENCOUNTER — Encounter (HOSPITAL_COMMUNITY): Payer: Self-pay | Admitting: Emergency Medicine

## 2015-09-26 ENCOUNTER — Emergency Department (HOSPITAL_COMMUNITY): Payer: Medicaid Other

## 2015-09-26 DIAGNOSIS — Z8739 Personal history of other diseases of the musculoskeletal system and connective tissue: Secondary | ICD-10-CM | POA: Insufficient documentation

## 2015-09-26 DIAGNOSIS — O99351 Diseases of the nervous system complicating pregnancy, first trimester: Secondary | ICD-10-CM | POA: Diagnosis not present

## 2015-09-26 DIAGNOSIS — G8929 Other chronic pain: Secondary | ICD-10-CM | POA: Insufficient documentation

## 2015-09-26 DIAGNOSIS — S60222A Contusion of left hand, initial encounter: Secondary | ICD-10-CM | POA: Diagnosis not present

## 2015-09-26 DIAGNOSIS — J45909 Unspecified asthma, uncomplicated: Secondary | ICD-10-CM | POA: Diagnosis not present

## 2015-09-26 DIAGNOSIS — Z8659 Personal history of other mental and behavioral disorders: Secondary | ICD-10-CM | POA: Diagnosis not present

## 2015-09-26 DIAGNOSIS — Z87891 Personal history of nicotine dependence: Secondary | ICD-10-CM | POA: Insufficient documentation

## 2015-09-26 DIAGNOSIS — S60410A Abrasion of right index finger, initial encounter: Secondary | ICD-10-CM | POA: Diagnosis not present

## 2015-09-26 DIAGNOSIS — Y9389 Activity, other specified: Secondary | ICD-10-CM | POA: Diagnosis not present

## 2015-09-26 DIAGNOSIS — O99511 Diseases of the respiratory system complicating pregnancy, first trimester: Secondary | ICD-10-CM | POA: Insufficient documentation

## 2015-09-26 DIAGNOSIS — Z880 Allergy status to penicillin: Secondary | ICD-10-CM | POA: Insufficient documentation

## 2015-09-26 DIAGNOSIS — Z91040 Latex allergy status: Secondary | ICD-10-CM | POA: Insufficient documentation

## 2015-09-26 DIAGNOSIS — Y990 Civilian activity done for income or pay: Secondary | ICD-10-CM | POA: Insufficient documentation

## 2015-09-26 DIAGNOSIS — Y92513 Shop (commercial) as the place of occurrence of the external cause: Secondary | ICD-10-CM | POA: Diagnosis not present

## 2015-09-26 DIAGNOSIS — W230XXA Caught, crushed, jammed, or pinched between moving objects, initial encounter: Secondary | ICD-10-CM | POA: Insufficient documentation

## 2015-09-26 DIAGNOSIS — Z3A01 Less than 8 weeks gestation of pregnancy: Secondary | ICD-10-CM | POA: Diagnosis not present

## 2015-09-26 DIAGNOSIS — O9A211 Injury, poisoning and certain other consequences of external causes complicating pregnancy, first trimester: Secondary | ICD-10-CM | POA: Diagnosis present

## 2015-09-26 DIAGNOSIS — S60419A Abrasion of unspecified finger, initial encounter: Secondary | ICD-10-CM

## 2015-09-26 MED ORDER — TRAMADOL HCL 50 MG PO TABS: 50.0000 mg | ORAL_TABLET | Freq: Once | ORAL | Status: DC

## 2015-09-26 MED ORDER — OXYCODONE-ACETAMINOPHEN 5-325 MG PO TABS
1.0000 | ORAL_TABLET | Freq: Once | ORAL | Status: AC
  Administered 2015-09-26: 1 via ORAL
  Filled 2015-09-26: qty 1

## 2015-09-26 MED ORDER — BACITRACIN ZINC 500 UNIT/GM EX OINT
TOPICAL_OINTMENT | Freq: Two times a day (BID) | CUTANEOUS | Status: DC
Start: 2015-09-26 — End: 2015-09-26

## 2015-09-26 NOTE — ED Notes (Signed)
Patient is alert and orientedx4.  Patient was explained discharge instructions and they understood them with no questions.  The patient's fiancee, Flint Melter is taking the patient home.
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
## 2015-09-26 NOTE — Discharge Instructions (Signed)
Your x-rays today show no fracture or dislocation. Continue to buddy tape your fingers for comfort. Take tylenol as needed for pain. Elevate the hand and apply ice. Follow up with Dr. Jodi Mourning.
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
## 2015-09-26 NOTE — ED Notes (Signed)
Patient transported to X-ray 

## 2015-09-27 ENCOUNTER — Other Ambulatory Visit (HOSPITAL_COMMUNITY): Payer: Self-pay | Admitting: *Deleted

## 2015-09-27 ENCOUNTER — Ambulatory Visit (HOSPITAL_COMMUNITY)
Admission: RE | Admit: 2015-09-27 | Discharge: 2015-09-27 | Disposition: A | Discharge status: Home / Self Care | Payer: Medicaid Other | Source: Ambulatory Visit | Attending: Certified Nurse Midwife | Admitting: Certified Nurse Midwife

## 2015-09-27 ENCOUNTER — Other Ambulatory Visit: Payer: Self-pay | Admitting: Certified Nurse Midwife

## 2015-09-27 DIAGNOSIS — O26892 Other specified pregnancy related conditions, second trimester: Secondary | ICD-10-CM | POA: Diagnosis not present

## 2015-09-27 DIAGNOSIS — R1012 Left upper quadrant pain: Secondary | ICD-10-CM | POA: Diagnosis not present

## 2015-09-27 DIAGNOSIS — O0972 Supervision of high risk pregnancy due to social problems, second trimester: Secondary | ICD-10-CM

## 2015-09-27 DIAGNOSIS — M329 Systemic lupus erythematosus, unspecified: Secondary | ICD-10-CM

## 2015-09-27 DIAGNOSIS — Z3A19 19 weeks gestation of pregnancy: Secondary | ICD-10-CM

## 2015-09-27 DIAGNOSIS — Z1389 Encounter for screening for other disorder: Secondary | ICD-10-CM

## 2015-09-27 DIAGNOSIS — M797 Fibromyalgia: Secondary | ICD-10-CM

## 2015-09-27 DIAGNOSIS — O99891 Other specified diseases and conditions complicating pregnancy: Secondary | ICD-10-CM

## 2015-09-27 DIAGNOSIS — Z87891 Personal history of nicotine dependence: Secondary | ICD-10-CM | POA: Insufficient documentation

## 2015-09-27 DIAGNOSIS — Z363 Encounter for antenatal screening for malformations: Secondary | ICD-10-CM

## 2015-09-27 DIAGNOSIS — O9989 Other specified diseases and conditions complicating pregnancy, childbirth and the puerperium: Principal | ICD-10-CM

## 2015-09-27 LAB — COMPREHENSIVE METABOLIC PANEL
ALBUMIN: 3.3 g/dL — AB (ref 3.5–5.0)
ALK PHOS: 55 U/L (ref 38–126)
ALT: 34 U/L (ref 14–54)
AST: 30 U/L (ref 15–41)
Anion gap: 9 (ref 5–15)
BUN: 6 mg/dL (ref 6–20)
CALCIUM: 8.2 mg/dL — AB (ref 8.9–10.3)
CO2: 20 mmol/L — AB (ref 22–32)
CREATININE: 0.46 mg/dL (ref 0.44–1.00)
Chloride: 103 mmol/L (ref 101–111)
GFR calc Af Amer: >60 mL/min (ref >60)
GFR calc non Af Amer: >60 mL/min (ref >60)
GLUCOSE: 75 mg/dL (ref 65–99)
Potassium: 3.6 mmol/L (ref 3.5–5.1)
SODIUM: 132 mmol/L — AB (ref 135–145)
Total Bilirubin: 0.3 mg/dL (ref 0.3–1.2)
Total Protein: 6.7 g/dL (ref 6.5–8.1)
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
## 2015-09-28 LAB — SJOGRENS SYNDROME-B EXTRACTABLE NUCLEAR ANTIBODY

## 2015-09-28 LAB — SJOGRENS SYNDROME-A EXTRACTABLE NUCLEAR ANTIBODY

## 2015-09-29 LAB — ANTINUCLEAR ANTIBODIES, IFA: ANA Ab, IFA: NEGATIVE

## 2015-09-29 LAB — BETA-2-GLYCOPROTEIN I ABS, IGG/M/A
Beta-2 Glyco I IgG: <9 GPI IgG units (ref 0–20)
Beta-2-Glycoprotein I IgA: <9 GPI IgA units (ref 0–25)

## 2015-09-30 ENCOUNTER — Encounter: Payer: Medicaid Other | Admitting: Obstetrics

## 2015-09-30 LAB — LUPUS ANTICOAGULANT PANEL
DRVVT: 36.1 s (ref 0.0–44.0)
PTT Lupus Anticoagulant: 41.4 s — ABNORMAL HIGH (ref 0.0–40.6)

## 2015-09-30 LAB — CARDIOLIPIN ANTIBODIES, IGG, IGM, IGA
Anticardiolipin IgG: <9 GPL U/mL (ref 0–14)
Anticardiolipin IgM: <9 MPL U/mL (ref 0–12)

## 2015-09-30 LAB — HEXAGONAL PHASE PHOSPHOLIPID: Hexagonal Phase Phospholipid: 4 s (ref 0–11)

## 2015-09-30 LAB — PTT-LA INCUB MIX: PTT-LA Incub Mix: 41.7 s — ABNORMAL HIGH (ref 0.0–40.6)

## 2015-09-30 LAB — PTT-LA MIX: PTT-LA MIX: 37.9 s (ref 0.0–40.6)

## 2015-10-01 ENCOUNTER — Encounter: Payer: Self-pay | Admitting: Obstetrics

## 2015-10-01 ENCOUNTER — Telehealth (HOSPITAL_COMMUNITY): Payer: Self-pay | Admitting: *Deleted

## 2015-10-01 ENCOUNTER — Ambulatory Visit (INDEPENDENT_AMBULATORY_CARE_PROVIDER_SITE_OTHER): Payer: Medicaid Other | Admitting: Obstetrics

## 2015-10-01 VITALS — BP 111/69 | HR 69 | Temp 98.0°F | Wt 162.0 lb

## 2015-10-01 DIAGNOSIS — Z3482 Encounter for supervision of other normal pregnancy, second trimester: Secondary | ICD-10-CM

## 2015-10-01 DIAGNOSIS — O0992 Supervision of high risk pregnancy, unspecified, second trimester: Secondary | ICD-10-CM

## 2015-10-01 MED ORDER — DOXYLAMINE-PYRIDOXINE 10-10 MG PO TBEC: DELAYED_RELEASE_TABLET | ORAL | Status: DC

## 2015-10-01 MED ORDER — VITAFOL GUMMIES 3.33-0.333-34.8 MG PO CHEW: 3.0000 | CHEWABLE_TABLET | Freq: Every day | ORAL | Status: DC

## 2015-10-01 NOTE — Telephone Encounter (Signed)
Called patient with recent lab results from 2/17.  Reviewed with Dr. Lisbeth Renshaw and found to be normal.  Patient did not turn in 24 hour urine yesterday as she had to go out of town.  Informed her to let Dr. Jodi Mourning know and he can repeat it at his office. Pt verbalized understanding.

## 2015-10-01 NOTE — Progress Notes (Signed)
Subjective:    [name] is a 25 y.o. female being seen today for her obstetrical visit. She is at [redacted]w[redacted]d gestation. Patient reports: no complaints . Fetal movement: normal.  Problem List Items Addressed This Visit    None    Visit Diagnoses    Supervision of other normal pregnancy, antepartum, second trimester    -  Primary    Relevant Orders    POCT urinalysis dipstick    Supervision of high risk pregnancy, antepartum, second trimester          Patient Active Problem List   Diagnosis Date Noted  . Bacteremia   . PID (acute pelvic inflammatory disease) 11/20/2014  . Migraine headache with aura 11/20/2014  . Trichomonal cervicitis 11/20/2014  . Nausea and vomiting 11/19/2014  . Strep pharyngitis 11/19/2014  . SOB (shortness of breath) 02/17/2011  . Bruises easily/rash 02/17/2011  . Nausea 02/17/2011  . Poor appetite 02/17/2011  . Abdominal pain 02/17/2011  . Joint pain 02/17/2011   Objective:    BP 111/69 mmHg  Pulse 69  Temp(Src) 98 F (36.7 C)  Wt 162 lb (73.483 kg)  LMP 04/23/2015 (Approximate) FHT: 150 BPM  Uterine Size: size equals dates     Assessment:    Pregnancy @ [redacted]w[redacted]d    Plan:    Signs and symptoms of preterm labor: discussed.  Labs, problem list reviewed and updated 2 hr GTT planned Follow up in 4 weeks.

## 2015-10-06 ENCOUNTER — Other Ambulatory Visit: Payer: Self-pay | Admitting: Certified Nurse Midwife

## 2015-10-06 DIAGNOSIS — D6862 Lupus anticoagulant syndrome: Secondary | ICD-10-CM

## 2015-10-06 DIAGNOSIS — O99112 Other diseases of the blood and blood-forming organs and certain disorders involving the immune mechanism complicating pregnancy, second trimester: Principal | ICD-10-CM

## 2015-10-07 ENCOUNTER — Other Ambulatory Visit: Payer: Self-pay | Admitting: Obstetrics

## 2015-10-07 ENCOUNTER — Telehealth: Payer: Self-pay | Admitting: Hematology

## 2015-10-07 DIAGNOSIS — L089 Local infection of the skin and subcutaneous tissue, unspecified: Secondary | ICD-10-CM

## 2015-10-07 MED ORDER — CLINDAMYCIN HCL 300 MG PO CAPS: 300.0000 mg | ORAL_CAPSULE | Freq: Three times a day (TID) | ORAL | Status: DC

## 2015-10-07 MED ORDER — NEOMYCIN-FLUOCINOLONE 0.5-0.025 % EX CREA: 1.0000 "application " | TOPICAL_CREAM | Freq: Two times a day (BID) | CUTANEOUS | Status: DC

## 2015-10-07 NOTE — Telephone Encounter (Signed)
Spoke to pt regarding referral from [facility]. Scheduled appt w/ Dr. Irene Limbo on 10/14/2014 at 2:00pm. Pt confirmed time and appointment.

## 2015-10-10 ENCOUNTER — Other Ambulatory Visit: Payer: Self-pay | Admitting: Certified Nurse Midwife

## 2015-10-10 ENCOUNTER — Emergency Department (HOSPITAL_COMMUNITY)
Admission: EM | Admit: 2015-10-10 | Discharge: 2015-10-10 | Disposition: A | Discharge status: Home / Self Care | Payer: Medicaid Other | Attending: Emergency Medicine | Admitting: Emergency Medicine

## 2015-10-10 ENCOUNTER — Encounter (HOSPITAL_COMMUNITY): Payer: Self-pay

## 2015-10-10 DIAGNOSIS — W57XXXA Bitten or stung by nonvenomous insect and other nonvenomous arthropods, initial encounter: Secondary | ICD-10-CM | POA: Insufficient documentation

## 2015-10-10 DIAGNOSIS — O99512 Diseases of the respiratory system complicating pregnancy, second trimester: Secondary | ICD-10-CM | POA: Diagnosis not present

## 2015-10-10 DIAGNOSIS — Z880 Allergy status to penicillin: Secondary | ICD-10-CM | POA: Insufficient documentation

## 2015-10-10 DIAGNOSIS — Z3A20 20 weeks gestation of pregnancy: Secondary | ICD-10-CM | POA: Insufficient documentation

## 2015-10-10 DIAGNOSIS — Z87891 Personal history of nicotine dependence: Secondary | ICD-10-CM | POA: Diagnosis not present

## 2015-10-10 DIAGNOSIS — Z8739 Personal history of other diseases of the musculoskeletal system and connective tissue: Secondary | ICD-10-CM | POA: Diagnosis not present

## 2015-10-10 DIAGNOSIS — R1901 Right upper quadrant abdominal swelling, mass and lump: Secondary | ICD-10-CM | POA: Diagnosis not present

## 2015-10-10 DIAGNOSIS — Y998 Other external cause status: Secondary | ICD-10-CM | POA: Insufficient documentation

## 2015-10-10 DIAGNOSIS — Z8781 Personal history of (healed) traumatic fracture: Secondary | ICD-10-CM | POA: Insufficient documentation

## 2015-10-10 DIAGNOSIS — Y9389 Activity, other specified: Secondary | ICD-10-CM | POA: Diagnosis not present

## 2015-10-10 DIAGNOSIS — Z8679 Personal history of other diseases of the circulatory system: Secondary | ICD-10-CM | POA: Insufficient documentation

## 2015-10-10 DIAGNOSIS — G8929 Other chronic pain: Secondary | ICD-10-CM | POA: Diagnosis not present

## 2015-10-10 DIAGNOSIS — Z91040 Latex allergy status: Secondary | ICD-10-CM | POA: Diagnosis not present

## 2015-10-10 DIAGNOSIS — O9A212 Injury, poisoning and certain other consequences of external causes complicating pregnancy, second trimester: Secondary | ICD-10-CM | POA: Insufficient documentation

## 2015-10-10 DIAGNOSIS — O9989 Other specified diseases and conditions complicating pregnancy, childbirth and the puerperium: Secondary | ICD-10-CM | POA: Insufficient documentation

## 2015-10-10 DIAGNOSIS — O99352 Diseases of the nervous system complicating pregnancy, second trimester: Secondary | ICD-10-CM | POA: Diagnosis not present

## 2015-10-10 DIAGNOSIS — O99342 Other mental disorders complicating pregnancy, second trimester: Secondary | ICD-10-CM | POA: Diagnosis not present

## 2015-10-10 DIAGNOSIS — Y9289 Other specified places as the place of occurrence of the external cause: Secondary | ICD-10-CM | POA: Insufficient documentation

## 2015-10-10 DIAGNOSIS — J45909 Unspecified asthma, uncomplicated: Secondary | ICD-10-CM | POA: Diagnosis not present

## 2015-10-10 DIAGNOSIS — F329 Major depressive disorder, single episode, unspecified: Secondary | ICD-10-CM | POA: Insufficient documentation

## 2015-10-10 DIAGNOSIS — S0086XA Insect bite (nonvenomous) of other part of head, initial encounter: Secondary | ICD-10-CM | POA: Diagnosis not present

## 2015-10-10 DIAGNOSIS — F419 Anxiety disorder, unspecified: Secondary | ICD-10-CM | POA: Insufficient documentation

## 2015-10-10 MED ORDER — DIPHENHYDRAMINE HCL 25 MG PO CAPS
50.0000 mg | ORAL_CAPSULE | Freq: Once | ORAL | Status: AC
  Administered 2015-10-10: 50 mg via ORAL
  Filled 2015-10-10: qty 2

## 2015-10-10 MED ORDER — DIPHENHYDRAMINE HCL 25 MG PO CAPS
ORAL_CAPSULE | ORAL | Status: AC
  Filled 2015-10-10: qty 2

## 2015-10-10 NOTE — ED Notes (Signed)
Pt presents with c/o spider bite to her right chin that occurred 2 days ago. Pt reports that the area is painful to touch and is itching, area is red. Pt also reports she has a lump on her back close to her armpit that her OBGYN would like [place] to evaluate. Pt also reports that she is 5 months pregnant.

## 2015-10-10 NOTE — Progress Notes (Signed)
John, ED RN notified by RROB of orders from Kandis Cocking, CNM for ED staff to Dopplar fetal heart tones, call the OB office in the morning to get an appt to have lump in back assessed, and patient may have 50mg  benadryl PO once for discomfort from bite; orders repeated back to RROB and will be passed on to ED PA; pt cleared obstetrically at this time

## 2015-10-10 NOTE — Progress Notes (Signed)
RROB called Kandis Cocking, CNM to notify of patient arrival to [facility] ED and complaints of a spider bite to chin 2 days ago that was irritating her as well as her request for her OB to assess a lump that was on her back; informed her that patient is a G2P1 at 20 weeks and 6/7 days along in her pregnancy; CNM is familiar with patient and gave orders for 50mg  benadryl PO for discomfort, dopplar of FHT by ED staff, and for her to make an appt with her OB office in the morning. Patient able to be cleared obstetrically at this time.

## 2015-10-10 NOTE — Progress Notes (Signed)
RROB called about patient who arrived to [facility] ED who is 20 weeks and 6/7 days along in her pregnancy that presents with complaints of a spider bite to chin 2 days ago that was red, itchy, and swollen. Patient also complains of a lump on her back that she was requesting for her OB to assess at this time; RROB to call CNM on call at this time for further orders and instruction
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
## 2015-10-10 NOTE — Discharge Instructions (Signed)
Please read and follow all provided instructions.  Your diagnoses today include:  1. Insect bite    Tests performed today include:  Vital signs. See below for your results today.   Medications prescribed:   None  Home care instructions:  Follow any educational materials contained in this packet.  Follow-up instructions: Please follow-up at your scheduled appointment on Monday  Return instructions:   Please return to the Emergency Department if you do not get better, if you get worse, or new symptoms OR  - Fever (temperature greater than 101.72F)  - Bleeding that does not stop with holding pressure to the area    -Severe pain (please note that you may be more sore the day after your accident)  - Chest Pain  - Difficulty breathing  - Severe nausea or vomiting  - Inability to tolerate food and liquids  - Passing out  - Skin becoming red around your wounds  - Change in mental status (confusion or lethargy)  - New numbness or weakness     Please return if you have any other emergent concerns.  Additional Information:  Your vital signs today were: BP 111/61 mmHg   Pulse 62   Temp(Src) 97.6 F (36.4 C) (Oral)   Resp 16   SpO2 100%   LMP 04/23/2015 (Approximate) If your blood pressure (BP) was elevated above 135/85 this visit, please have this repeated by your doctor within one month. ---------------

## 2015-10-13 ENCOUNTER — Inpatient Hospital Stay (HOSPITAL_COMMUNITY)
Admission: AD | Admit: 2015-10-13 | Discharge: 2015-10-13 | Disposition: A | Discharge status: Home / Self Care | Payer: Medicaid Other | Source: Ambulatory Visit | Attending: Obstetrics | Admitting: Obstetrics

## 2015-10-13 ENCOUNTER — Encounter (HOSPITAL_COMMUNITY): Payer: Self-pay | Admitting: *Deleted

## 2015-10-13 DIAGNOSIS — Z3A21 21 weeks gestation of pregnancy: Secondary | ICD-10-CM | POA: Insufficient documentation

## 2015-10-13 DIAGNOSIS — R51 Headache: Secondary | ICD-10-CM | POA: Diagnosis present

## 2015-10-13 DIAGNOSIS — Z87891 Personal history of nicotine dependence: Secondary | ICD-10-CM | POA: Insufficient documentation

## 2015-10-13 DIAGNOSIS — Z885 Allergy status to narcotic agent status: Secondary | ICD-10-CM | POA: Diagnosis not present

## 2015-10-13 DIAGNOSIS — O210 Mild hyperemesis gravidarum: Secondary | ICD-10-CM | POA: Insufficient documentation

## 2015-10-13 DIAGNOSIS — Z880 Allergy status to penicillin: Secondary | ICD-10-CM | POA: Diagnosis not present

## 2015-10-13 DIAGNOSIS — O219 Vomiting of pregnancy, unspecified: Secondary | ICD-10-CM

## 2015-10-13 DIAGNOSIS — O26892 Other specified pregnancy related conditions, second trimester: Secondary | ICD-10-CM | POA: Insufficient documentation

## 2015-10-13 LAB — URINALYSIS, ROUTINE W REFLEX MICROSCOPIC
Bilirubin Urine: NEGATIVE
Glucose, UA: NEGATIVE mg/dL
HGB URINE DIPSTICK: NEGATIVE
Ketones, ur: >80 mg/dL — AB
Leukocytes, UA: NEGATIVE
Nitrite: NEGATIVE
PROTEIN: NEGATIVE mg/dL
Specific Gravity, Urine: 1.015 (ref 1.005–1.030)
pH: 8.5 — ABNORMAL HIGH (ref 5.0–8.0)

## 2015-10-13 MED ORDER — DIPHENHYDRAMINE HCL 50 MG/ML IJ SOLN
25.0000 mg | Freq: Once | INTRAMUSCULAR | Status: AC
  Administered 2015-10-13: 25 mg via INTRAVENOUS
  Filled 2015-10-13: qty 1

## 2015-10-13 MED ORDER — LACTATED RINGERS IV SOLN
Freq: Once | INTRAVENOUS | Status: AC
  Administered 2015-10-13: 12:00:00 via INTRAVENOUS

## 2015-10-13 MED ORDER — PROMETHAZINE HCL 25 MG RE SUPP: 25.0000 mg | Freq: Four times a day (QID) | RECTAL | Status: DC | PRN

## 2015-10-13 MED ORDER — ONDANSETRON 8 MG PO TBDP
8.0000 mg | ORAL_TABLET | Freq: Once | ORAL | Status: AC
  Administered 2015-10-13: 8 mg via ORAL
  Filled 2015-10-13: qty 1

## 2015-10-13 MED ORDER — METOCLOPRAMIDE HCL 5 MG/ML IJ SOLN
10.0000 mg | Freq: Once | INTRAMUSCULAR | Status: AC
  Administered 2015-10-13: 10 mg via INTRAVENOUS
  Filled 2015-10-13: qty 2

## 2015-10-13 MED ORDER — M.V.I. ADULT IV INJ
Freq: Once | INTRAVENOUS | Status: AC
  Administered 2015-10-13: 14:00:00 via INTRAVENOUS
  Filled 2015-10-13: qty 1000

## 2015-10-13 MED ORDER — DEXAMETHASONE SODIUM PHOSPHATE 10 MG/ML IJ SOLN
10.0000 mg | Freq: Once | INTRAMUSCULAR | Status: AC
  Administered 2015-10-13: 10 mg via INTRAVENOUS
  Filled 2015-10-13: qty 1

## 2015-10-13 MED ORDER — ACETAMINOPHEN 325 MG PO TABS: 650.0000 mg | ORAL_TABLET | Freq: Once | ORAL | Status: DC

## 2015-10-13 NOTE — MAU Note (Signed)
Headache since awaking yesterday, not able to keep anything down, felt feverish last nigh did not have a thermometer, cough, sore throat. Has a history of migraines.
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
## 2015-10-13 NOTE — Discharge Instructions (Signed)
Keep your appointments as scheduled.  Pick up  Phenergan from your pharmacy if vomiting worsens. Rest today and drink lots of fluids. Return if your headache becomes severe.

## 2015-10-14 ENCOUNTER — Encounter: Payer: Self-pay | Admitting: Hematology

## 2015-10-14 ENCOUNTER — Other Ambulatory Visit: Payer: Self-pay | Admitting: Obstetrics

## 2015-10-14 ENCOUNTER — Inpatient Hospital Stay (HOSPITAL_COMMUNITY)
Admission: AD | Admit: 2015-10-14 | Discharge: 2015-10-14 | Disposition: A | Discharge status: Home / Self Care | Payer: Medicaid Other | Source: Ambulatory Visit | Attending: Obstetrics | Admitting: Obstetrics

## 2015-10-14 ENCOUNTER — Telehealth: Payer: Self-pay | Admitting: Hematology

## 2015-10-14 ENCOUNTER — Ambulatory Visit (HOSPITAL_BASED_OUTPATIENT_CLINIC_OR_DEPARTMENT_OTHER): Payer: Medicaid Other | Admitting: Hematology

## 2015-10-14 VITALS — BP 117/64 | HR 116 | Temp 99.1°F | Resp 19 | Wt 163.4 lb

## 2015-10-14 DIAGNOSIS — D6862 Lupus anticoagulant syndrome: Secondary | ICD-10-CM | POA: Insufficient documentation

## 2015-10-14 DIAGNOSIS — O26892 Other specified pregnancy related conditions, second trimester: Secondary | ICD-10-CM | POA: Diagnosis not present

## 2015-10-14 DIAGNOSIS — R05 Cough: Secondary | ICD-10-CM | POA: Insufficient documentation

## 2015-10-14 DIAGNOSIS — Z538 Procedure and treatment not carried out for other reasons: Secondary | ICD-10-CM | POA: Diagnosis not present

## 2015-10-14 DIAGNOSIS — D171 Benign lipomatous neoplasm of skin and subcutaneous tissue of trunk: Secondary | ICD-10-CM | POA: Insufficient documentation

## 2015-10-14 DIAGNOSIS — J111 Influenza due to unidentified influenza virus with other respiratory manifestations: Secondary | ICD-10-CM

## 2015-10-14 DIAGNOSIS — R222 Localized swelling, mass and lump, trunk: Secondary | ICD-10-CM

## 2015-10-14 DIAGNOSIS — M791 Myalgia, unspecified site: Secondary | ICD-10-CM

## 2015-10-14 MED ORDER — OSELTAMIVIR PHOSPHATE 75 MG PO CAPS: 75.0000 mg | ORAL_CAPSULE | Freq: Two times a day (BID) | ORAL | Status: DC

## 2015-10-14 MED ORDER — OXYCODONE-ACETAMINOPHEN 5-325 MG PO TABS: 1.0000 | ORAL_TABLET | ORAL | Status: DC | PRN

## 2015-10-14 NOTE — Telephone Encounter (Signed)
PT CALLED IN TO REQUEST A LATER TIME DUE TO OB SENDING HER TO [place]
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
## 2015-10-14 NOTE — MAU Note (Signed)
Pt left to go to Dr Jacelyn Grip office.

## 2015-10-14 NOTE — MAU Note (Signed)
Pt presents to MAU with complaints of fever, cough, nausea, vomiting and  Discomfort in her upper abdomen. Called Dr Jodi Mourning and was told to come in because she has the flu

## 2015-10-14 NOTE — MAU Note (Signed)
Urine in lab 

## 2015-10-15 ENCOUNTER — Encounter: Payer: Self-pay | Admitting: *Deleted

## 2015-10-29 ENCOUNTER — Encounter: Payer: Medicaid Other | Admitting: Obstetrics

## 2015-10-30 ENCOUNTER — Ambulatory Visit (INDEPENDENT_AMBULATORY_CARE_PROVIDER_SITE_OTHER): Payer: Medicaid Other | Admitting: Certified Nurse Midwife

## 2015-10-30 VITALS — BP 110/64 | HR 70 | Temp 97.7°F | Wt 170.0 lb

## 2015-10-30 DIAGNOSIS — Z3482 Encounter for supervision of other normal pregnancy, second trimester: Secondary | ICD-10-CM

## 2015-10-30 LAB — POCT URINALYSIS DIPSTICK
Bilirubin, UA: NEGATIVE
Glucose, UA: NEGATIVE
KETONES UA: NEGATIVE
Leukocytes, UA: NEGATIVE
Nitrite, UA: NEGATIVE
RBC UA: NEGATIVE
SPEC GRAV UA: 1.015
UROBILINOGEN UA: NEGATIVE
pH, UA: 6

## 2015-10-31 NOTE — Progress Notes (Signed)
Subjective:    [name] is a 25 y.o. female being seen today for her obstetrical visit. She is at [redacted]w[redacted]d gestation. Patient reports: no complaints . Fetal movement: normal.  Problem List Items Addressed This Visit    None    Visit Diagnoses    Supervision of other normal pregnancy, antepartum, second trimester    -  Primary    Relevant Orders    POCT urinalysis dipstick (Completed)      Patient Active Problem List   Diagnosis Date Noted  . 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 Bacteremia   . PID (acute pelvic inflammatory disease) 11/20/2014  . Migraine headache with aura 11/20/2014  . Trichomonal cervicitis 11/20/2014  . Nausea and vomiting 11/19/2014  . Strep pharyngitis 11/19/2014  . SOB (shortness of breath) 02/17/2011  . Bruises easily/rash 02/17/2011  . Nausea 02/17/2011  . Poor appetite 02/17/2011  . Abdominal pain 02/17/2011  . Joint pain 02/17/2011   Objective:    BP 110/64 mmHg  Pulse 70  Temp(Src) 97.7 F (36.5 C)  Wt 170 lb (77.111 kg)  LMP 04/23/2015 (Approximate) FHT: 145 BPM  Uterine Size: size equals dates     Assessment:    Pregnancy @ [redacted]w[redacted]d    Doing well  Plan:    OBGCT: discussed and ordered for next visit. Signs and symptoms of preterm labor: discussed.  Labs, problem list reviewed and updated 2 hr GTT planned Follow up in 4 weeks.

## 2015-11-08 ENCOUNTER — Encounter (HOSPITAL_COMMUNITY): Payer: Self-pay

## 2015-11-08 ENCOUNTER — Ambulatory Visit (HOSPITAL_COMMUNITY)
Admission: RE | Admit: 2015-11-08 | Discharge: 2015-11-08 | Disposition: A | Discharge status: Home / Self Care | Payer: Medicaid Other | Source: Ambulatory Visit | Attending: Obstetrics | Admitting: Obstetrics

## 2015-11-08 DIAGNOSIS — Z3A25 25 weeks gestation of pregnancy: Secondary | ICD-10-CM | POA: Diagnosis not present

## 2015-11-08 DIAGNOSIS — O9989 Other specified diseases and conditions complicating pregnancy, childbirth and the puerperium: Secondary | ICD-10-CM

## 2015-11-08 DIAGNOSIS — M329 Systemic lupus erythematosus, unspecified: Secondary | ICD-10-CM

## 2015-11-08 DIAGNOSIS — Z36 Encounter for antenatal screening of mother: Secondary | ICD-10-CM | POA: Insufficient documentation

## 2015-11-12 ENCOUNTER — Other Ambulatory Visit: Payer: Self-pay | Admitting: Certified Nurse Midwife

## 2015-11-24 ENCOUNTER — Inpatient Hospital Stay (HOSPITAL_COMMUNITY)
Admission: AD | Admit: 2015-11-24 | Discharge: 2015-11-24 | Disposition: A | Discharge status: Home / Self Care | Payer: Medicaid Other | Source: Ambulatory Visit | Attending: Obstetrics | Admitting: Obstetrics

## 2015-11-24 ENCOUNTER — Encounter (HOSPITAL_COMMUNITY): Payer: Self-pay | Admitting: *Deleted

## 2015-11-24 DIAGNOSIS — K0889 Other specified disorders of teeth and supporting structures: Secondary | ICD-10-CM | POA: Diagnosis not present

## 2015-11-24 DIAGNOSIS — M791 Myalgia, unspecified site: Secondary | ICD-10-CM

## 2015-11-24 DIAGNOSIS — O26892 Other specified pregnancy related conditions, second trimester: Secondary | ICD-10-CM | POA: Insufficient documentation

## 2015-11-24 DIAGNOSIS — Z3A27 27 weeks gestation of pregnancy: Secondary | ICD-10-CM | POA: Diagnosis not present

## 2015-11-24 DIAGNOSIS — K029 Dental caries, unspecified: Secondary | ICD-10-CM | POA: Diagnosis not present

## 2015-11-24 DIAGNOSIS — O9989 Other specified diseases and conditions complicating pregnancy, childbirth and the puerperium: Secondary | ICD-10-CM

## 2015-11-24 DIAGNOSIS — M797 Fibromyalgia: Secondary | ICD-10-CM | POA: Diagnosis not present

## 2015-11-24 DIAGNOSIS — Z87891 Personal history of nicotine dependence: Secondary | ICD-10-CM | POA: Insufficient documentation

## 2015-11-24 MED ORDER — OXYCODONE-ACETAMINOPHEN 5-325 MG PO TABS: 1.0000 | ORAL_TABLET | Freq: Four times a day (QID) | ORAL | Status: DC | PRN

## 2015-11-24 MED ORDER — OXYCODONE-ACETAMINOPHEN 5-325 MG PO TABS
2.0000 | ORAL_TABLET | Freq: Once | ORAL | Status: AC
  Administered 2015-11-24: 2 via ORAL
  Filled 2015-11-24: qty 2

## 2015-11-24 NOTE — MAU Note (Signed)
Pt reports pain in her left upper wisdom tooth and has been taking tramadol and tylenol without relief.

## 2015-11-24 NOTE — Discharge Instructions (Signed)
Dental Pain Dental pain may be caused by many things, including:  Tooth decay (cavities or caries). Cavities cause the nerve of your tooth to be open to air and hot or cold temperatures. This can cause pain or discomfort.  Abscess or infection. A dental abscess is an area that is full of infected pus from a bacterial infection in the inner part of the tooth (pulp). It usually happens at the end of the tooth's root.  Injury.  An unknown reason (idiopathic). Your pain may be mild or severe. It may only happen when:  You are chewing.  You are exposed to hot or cold temperature.  You are eating or drinking sugary foods or beverages, such as:  Soda.  Candy. Your pain may also be there all of the time. HOME CARE Watch your dental pain for any changes. Do these things to lessen your discomfort:  Take medicines only as told by your dentist.  If your dentist tells you to take an antibiotic medicine, finish all of it even if you start to feel better.  Keep all follow-up visits as told by your dentist. This is important.  Do not apply heat to the outside of your face.  Rinse your mouth or gargle with salt water if told by your dentist. This helps with pain and swelling.  You can make salt water by adding  tsp of salt to 1 cup of warm water.  Apply ice to the painful area of your face:  Put ice in a plastic bag.  Place a towel between your skin and the bag.  Leave the ice on for 20 minutes, 2-3 times per day.  Avoid foods or drinks that cause you pain, such as:  Very hot or very cold foods or drinks.  Sweet or sugary foods or drinks. GET HELP IF:  Your pain is not helped with medicines.  Your symptoms are worse.  You have new symptoms. GET HELP RIGHT AWAY IF:  You cannot open your mouth.  You are having trouble breathing or swallowing.  You have a fever.  Your face, neck, or jaw is puffy (swollen).   This information is not intended to replace advice given to  you by your health care provider. Make sure you discuss any questions you have with your health care provider.   Document Released: 01/13/2008 Document Revised: 12/11/2014 Document Reviewed: 07/23/2014 Elsevier Interactive Patient Education [employer].
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
## 2015-11-24 NOTE — MAU Note (Signed)
Patient last took one tylenol pm at 1:30pm this afternoon for the pain.

## 2015-11-25 ENCOUNTER — Encounter: Payer: Medicaid Other | Admitting: Obstetrics

## 2015-11-25 ENCOUNTER — Other Ambulatory Visit: Payer: Medicaid Other

## 2015-11-26 ENCOUNTER — Other Ambulatory Visit: Payer: Medicaid Other

## 2015-11-26 ENCOUNTER — Ambulatory Visit (INDEPENDENT_AMBULATORY_CARE_PROVIDER_SITE_OTHER): Payer: Medicaid Other | Admitting: Obstetrics

## 2015-11-26 VITALS — BP 116/72 | HR 61 | Temp 97.6°F | Wt 178.0 lb

## 2015-11-26 DIAGNOSIS — Z3492 Encounter for supervision of normal pregnancy, unspecified, second trimester: Secondary | ICD-10-CM

## 2015-11-26 DIAGNOSIS — K029 Dental caries, unspecified: Secondary | ICD-10-CM

## 2015-11-26 LAB — POCT URINALYSIS DIPSTICK
Bilirubin, UA: NEGATIVE
GLUCOSE UA: NEGATIVE
KETONES UA: NEGATIVE
Leukocytes, UA: NEGATIVE
Nitrite, UA: NEGATIVE
PH UA: 7
PROTEIN UA: NEGATIVE
RBC UA: NEGATIVE
Urobilinogen, UA: NEGATIVE

## 2015-11-26 MED ORDER — OXYCODONE HCL 10 MG PO TABS: 10.0000 mg | ORAL_TABLET | Freq: Four times a day (QID) | ORAL | Status: DC | PRN

## 2015-11-26 NOTE — Progress Notes (Signed)
Patient states she started Braxton-Hicks x 1 week

## 2015-11-27 ENCOUNTER — Other Ambulatory Visit: Payer: Self-pay | Admitting: Obstetrics

## 2015-11-27 ENCOUNTER — Encounter: Payer: Self-pay | Admitting: Obstetrics

## 2015-11-27 LAB — CBC
Hematocrit: 31.3 % — ABNORMAL LOW (ref 34.0–46.6)
Hemoglobin: 10.6 g/dL — ABNORMAL LOW (ref 11.1–15.9)
MCH: 32.7 pg (ref 26.6–33.0)
MCHC: 33.9 g/dL (ref 31.5–35.7)
MCV: 97 fL (ref 79–97)
PLATELETS: 292 10*3/uL (ref 150–379)
RBC: 3.24 x10E6/uL — ABNORMAL LOW (ref 3.77–5.28)
RDW: 13 % (ref 12.3–15.4)
WBC: 9.5 10*3/uL (ref 3.4–10.8)

## 2015-11-27 LAB — RPR: RPR Ser Ql: NONREACTIVE

## 2015-11-27 LAB — GLUCOSE TOLERANCE, 2 HOURS W/ 1HR
GLUCOSE, FASTING: 69 mg/dL (ref 65–91)
Glucose, 1 hour: 65 mg/dL (ref 65–179)
Glucose, 2 hour: 61 mg/dL — ABNORMAL LOW (ref 65–152)

## 2015-11-27 LAB — HIV ANTIBODY (ROUTINE TESTING W REFLEX): HIV Screen 4th Generation wRfx: NONREACTIVE

## 2015-11-27 MED ORDER — CLINDAMYCIN HCL 300 MG PO CAPS: 300.0000 mg | ORAL_CAPSULE | Freq: Three times a day (TID) | ORAL | Status: DC

## 2015-11-27 NOTE — Progress Notes (Signed)
Subjective:    [name] is a 25 y.o. female being seen today for her obstetrical visit. She is at [redacted]w[redacted]d gestation. Patient reports: toothache . Fetal movement: normal.  Problem List Items Addressed This Visit    None    Visit Diagnoses    Prenatal care, second trimester    -  Primary    Relevant Orders    POCT urinalysis dipstick (Completed)    Glucose Tolerance, 2 Hours w/1 Hour    CBC    HIV antibody    RPR    Dental caries        Relevant Medications    Oxycodone HCl 10 MG TABS    Other Relevant Orders    Ambulatory referral to Dentistry    Glucose Tolerance, 2 Hours w/1 Hour    CBC    HIV antibody    RPR      Patient Active Problem List   Diagnosis Date Noted  . 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 Bacteremia   . PID (acute pelvic inflammatory disease) 11/20/2014  . Migraine headache with aura 11/20/2014  . Trichomonal cervicitis 11/20/2014  . Nausea and vomiting 11/19/2014  . Strep pharyngitis 11/19/2014  . SOB (shortness of breath) 02/17/2011  . Bruises easily/rash 02/17/2011  . Nausea 02/17/2011  . Poor appetite 02/17/2011  . Abdominal pain 02/17/2011  . Joint pain 02/17/2011   Objective:    BP 116/72 mmHg  Pulse 61  Temp(Src) 97.6 F (36.4 C)  Wt 178 lb (80.74 kg)  LMP 04/23/2015 (Approximate) FHT: 150 BPM  Uterine Size: size equals dates     Assessment:    Pregnancy @ [redacted]w[redacted]d    Plan:   Referred to Dentist   OBGCT: ordered. Signs and symptoms of preterm labor: discussed.  Labs, problem list reviewed and updated 2 hr GTT planned Follow up in 2 weeks.

## 2015-12-10 ENCOUNTER — Encounter: Payer: Medicaid Other | Admitting: Obstetrics

## 2015-12-12 ENCOUNTER — Encounter: Payer: Medicaid Other | Admitting: Obstetrics

## 2015-12-18 ENCOUNTER — Encounter: Payer: Self-pay | Admitting: Obstetrics

## 2015-12-18 ENCOUNTER — Ambulatory Visit (INDEPENDENT_AMBULATORY_CARE_PROVIDER_SITE_OTHER): Payer: Medicaid Other | Admitting: Obstetrics

## 2015-12-18 VITALS — BP 134/75 | HR 81

## 2015-12-18 DIAGNOSIS — Z3483 Encounter for supervision of other normal pregnancy, third trimester: Secondary | ICD-10-CM

## 2015-12-18 DIAGNOSIS — M549 Dorsalgia, unspecified: Secondary | ICD-10-CM

## 2015-12-18 LAB — POCT URINALYSIS DIPSTICK
Bilirubin, UA: NEGATIVE
Blood, UA: NEGATIVE
GLUCOSE UA: NEGATIVE
KETONES UA: NEGATIVE
LEUKOCYTES UA: NEGATIVE
Nitrite, UA: NEGATIVE
PROTEIN UA: NEGATIVE
SPEC GRAV UA: 1.015
Urobilinogen, UA: NEGATIVE
pH, UA: 7

## 2015-12-18 MED ORDER — CYCLOBENZAPRINE HCL 10 MG PO TABS: 10.0000 mg | ORAL_TABLET | Freq: Three times a day (TID) | ORAL | Status: DC | PRN

## 2015-12-18 NOTE — Progress Notes (Signed)
Pt is having increase in pelvic pain and pressure.

## 2015-12-18 NOTE — Progress Notes (Signed)
Subjective:    [name] is a 25 y.o. female being seen today for her obstetrical visit. She is at [redacted]w[redacted]d gestation. Patient reports backache and pressure. Fetal movement: normal.  Problem List Items Addressed This Visit    None    Visit Diagnoses    Encounter for supervision of other normal pregnancy in third trimester    -  Primary    Relevant Orders    POCT urinalysis dipstick    NuSwab VG+, Candida 6sp    Strep Gp B Culture+Rflx    Backache symptom        Relevant Medications    cyclobenzaprine (FLEXERIL) 10 MG tablet      Patient Active Problem List   Diagnosis Date Noted  . 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 Bacteremia   . PID (acute pelvic inflammatory disease) 11/20/2014  . Migraine headache with aura 11/20/2014  . Trichomonal cervicitis 11/20/2014  . Nausea and vomiting 11/19/2014  . Strep pharyngitis 11/19/2014  . SOB (shortness of breath) 02/17/2011  . Bruises easily/rash 02/17/2011  . Nausea 02/17/2011  . Poor appetite 02/17/2011  . Abdominal pain 02/17/2011  . Joint pain 02/17/2011   Objective:    LMP 04/23/2015 (Approximate) FHT:  150 BPM  Uterine Size: size equals dates  Presentation: unsure    NST:  Reassuring.  No UC's  Assessment:    Pregnancy @ [redacted]w[redacted]d weeks    Backache and pressure  Plan:     EFM reassuring with no UC's.   labs reviewed, problem list updated Consent signed. GBS sent TDAP offered  Rhogam given for RH negative Pediatrician: discussed. Infant feeding: plans to breastfeed. Maternity leave: discussed. Cigarette smoking: former smoker. Orders Placed This Encounter  Procedures  . Strep Gp B Culture+Rflx  . POCT urinalysis dipstick   Meds ordered this encounter  Medications  . cyclobenzaprine (FLEXERIL) 10 MG tablet    Sig: Take 1 tablet (10 mg total) by mouth every 8 (eight) hours as needed for muscle spasms.    Dispense:  30 tablet    Refill:  1   Follow up in 2 Weeks.

## 2015-12-22 ENCOUNTER — Other Ambulatory Visit: Payer: Self-pay | Admitting: Obstetrics

## 2015-12-22 DIAGNOSIS — B373 Candidiasis of vulva and vagina: Secondary | ICD-10-CM

## 2015-12-22 DIAGNOSIS — B3731 Acute candidiasis of vulva and vagina: Secondary | ICD-10-CM

## 2015-12-22 LAB — NUSWAB VG+, CANDIDA 6SP
CANDIDA GLABRATA, NAA: NEGATIVE
CANDIDA PARAPSILOSIS, NAA: NEGATIVE
CANDIDA TROPICALIS, NAA: NEGATIVE
CHLAMYDIA TRACHOMATIS, NAA: NEGATIVE
Candida albicans, NAA: POSITIVE — AB
Candida krusei, NAA: NEGATIVE
Candida lusitaniae, NAA: NEGATIVE
Neisseria gonorrhoeae, NAA: NEGATIVE
TRICH VAG BY NAA: NEGATIVE

## 2015-12-22 MED ORDER — FLUCONAZOLE 150 MG PO TABS
150.0000 mg | ORAL_TABLET | Freq: Once | ORAL | Status: DC
Start: 2015-12-22 — End: 2016-02-16

## 2015-12-25 LAB — STREP GP B SUSCEPTIBILITY

## 2015-12-25 LAB — STREP GP B CULTURE+RFLX: Strep Gp B Culture+Rflx: POSITIVE — AB

## 2016-01-01 ENCOUNTER — Encounter: Payer: Medicaid Other | Admitting: Obstetrics

## 2016-01-10 ENCOUNTER — Encounter (HOSPITAL_COMMUNITY): Payer: Self-pay

## 2016-01-10 ENCOUNTER — Inpatient Hospital Stay (HOSPITAL_COMMUNITY)
Admission: AD | Admit: 2016-01-10 | Discharge: 2016-01-10 | Disposition: A | Discharge status: Home / Self Care | Payer: Medicaid Other | Source: Ambulatory Visit | Attending: Obstetrics | Admitting: Obstetrics

## 2016-01-10 DIAGNOSIS — O9989 Other specified diseases and conditions complicating pregnancy, childbirth and the puerperium: Secondary | ICD-10-CM | POA: Diagnosis not present

## 2016-01-10 DIAGNOSIS — R1900 Intra-abdominal and pelvic swelling, mass and lump, unspecified site: Secondary | ICD-10-CM | POA: Insufficient documentation

## 2016-01-10 DIAGNOSIS — Z87891 Personal history of nicotine dependence: Secondary | ICD-10-CM | POA: Insufficient documentation

## 2016-01-10 DIAGNOSIS — O26893 Other specified pregnancy related conditions, third trimester: Secondary | ICD-10-CM | POA: Insufficient documentation

## 2016-01-10 DIAGNOSIS — Z880 Allergy status to penicillin: Secondary | ICD-10-CM | POA: Diagnosis not present

## 2016-01-10 DIAGNOSIS — Z3A34 34 weeks gestation of pregnancy: Secondary | ICD-10-CM | POA: Insufficient documentation

## 2016-01-10 DIAGNOSIS — Z885 Allergy status to narcotic agent status: Secondary | ICD-10-CM | POA: Insufficient documentation

## 2016-01-10 LAB — URINALYSIS, ROUTINE W REFLEX MICROSCOPIC
BILIRUBIN URINE: NEGATIVE
GLUCOSE, UA: NEGATIVE mg/dL
Hgb urine dipstick: NEGATIVE
KETONES UR: NEGATIVE mg/dL
Leukocytes, UA: NEGATIVE
NITRITE: NEGATIVE
PH: 6.5 (ref 5.0–8.0)
Protein, ur: NEGATIVE mg/dL
Specific Gravity, Urine: <1.005 — ABNORMAL LOW (ref 1.005–1.030)
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
## 2016-01-10 NOTE — Discharge Instructions (Signed)
Edema Edema is an abnormal buildup of fluids. It is more common in your legs and thighs. Painless swelling of the feet and ankles is more likely as a person ages. It also is common in looser skin, like around your eyes. HOME CARE   Keep the affected body part above the level of the heart while lying down.  Do not sit still or stand for a long time.  Do not put anything right under your knees when you lie down.  Do not wear tight clothes on your upper legs.  Exercise your legs to help the puffiness (swelling) go down.  Wear elastic bandages or support stockings as told by your doctor.  A low-salt diet may help lessen the puffiness.  Only take medicine as told by your doctor. GET HELP IF:  Treatment is not working.  You have heart, liver, or kidney disease and notice that your skin looks puffy or shiny.  You have puffiness in your legs that does not get better when you raise your legs.  You have sudden weight gain for no reason. GET HELP RIGHT AWAY IF:   You have shortness of breath or chest pain.  You cannot breathe when you lie down.  You have pain, redness, or warmth in the areas that are puffy.  You have heart, liver, or kidney disease and get edema all of a sudden.  You have a fever and your symptoms get worse all of a sudden. MAKE SURE YOU:   Understand these instructions.  Will watch your condition.  Will get help right away if you are not doing well or get worse.   This information is not intended to replace advice given to you by your health care provider. Make sure you discuss any questions you have with your health care provider.   Document Released: 01/13/2008 Document Revised: 08/01/2013 Document Reviewed: 05/19/2013 Elsevier Interactive Patient Education 2016 [place] of Breath Shortness of breath means you have trouble breathing. Shortness of breath needs medical care right away. HOME CARE   Do not smoke.  Avoid being around  chemicals or things (paint fumes, dust) that may bother your breathing.  Rest as needed. Slowly begin your normal activities.  Only take medicines as told by your doctor.  Keep all doctor visits as told. GET HELP RIGHT AWAY IF:   Your shortness of breath gets worse.  You feel lightheaded, pass out (faint), or have a cough that is not helped by medicine.  You cough up blood.  You have pain with breathing.  You have pain in your chest, arms, shoulders, or belly (abdomen).  You have a fever.  You cannot walk up stairs or exercise the way you normally do.  You do not get better in the time expected.  You have a hard time doing normal activities even with rest.  You have problems with your medicines.  You have any new symptoms. MAKE SURE YOU:  Understand these instructions.  Will watch your condition.  Will get help right away if you are not doing well or get worse.   This information is not intended to replace advice given to you by your health care provider. Make sure you discuss any questions you have with your health care provider.   Document Released: 01/13/2008 Document Revised: 08/01/2013 Document Reviewed: 10/12/2011 Elsevier Interactive Patient Education [employer].

## 2016-01-10 NOTE — MAU Note (Signed)
Onset of more edema in abdominal area and legs and feet for a week, more short of breath.

## 2016-01-10 NOTE — MAU Note (Signed)
Has abdominal swelling started a week ago.  Has been getting worse.  Complains of headaches and floaters. 6/10 pain.  Had vomiting 2 weeks ago with abdominal pain.

## 2016-01-10 NOTE — MAU Note (Signed)
Urine in lab 

## 2016-01-15 ENCOUNTER — Ambulatory Visit (INDEPENDENT_AMBULATORY_CARE_PROVIDER_SITE_OTHER): Payer: Medicaid Other | Admitting: Certified Nurse Midwife

## 2016-01-15 VITALS — BP 118/73 | HR 78

## 2016-01-15 DIAGNOSIS — Z3493 Encounter for supervision of normal pregnancy, unspecified, third trimester: Secondary | ICD-10-CM

## 2016-01-15 DIAGNOSIS — O219 Vomiting of pregnancy, unspecified: Secondary | ICD-10-CM

## 2016-01-15 DIAGNOSIS — O2343 Unspecified infection of urinary tract in pregnancy, third trimester: Secondary | ICD-10-CM

## 2016-01-15 LAB — POCT URINALYSIS DIPSTICK
Bilirubin, UA: NEGATIVE
Glucose, UA: NEGATIVE
Ketones, UA: NEGATIVE
Leukocytes, UA: NEGATIVE
NITRITE UA: NEGATIVE
PROTEIN UA: NEGATIVE
RBC UA: 250
UROBILINOGEN UA: NEGATIVE
pH, UA: 6

## 2016-01-15 MED ORDER — NITROFURANTOIN MONOHYD MACRO 100 MG PO CAPS
100.0000 mg | ORAL_CAPSULE | Freq: Two times a day (BID) | ORAL | Status: AC
Start: 2016-01-15 — End: 2016-01-22

## 2016-01-15 MED ORDER — ONDANSETRON HCL 4 MG PO TABS: 4.0000 mg | ORAL_TABLET | Freq: Every day | ORAL | Status: DC | PRN

## 2016-01-15 NOTE — Progress Notes (Signed)
Subjective:    [name] is a 25 y.o. female being seen today for her obstetrical visit. She is at [redacted]w[redacted]d gestation. Patient reports backache, bleeding, no leaking and occasional contractions. Fetal movement: normal.  Problem List Items Addressed This Visit    None    Visit Diagnoses    Prenatal care, third trimester    -  Primary      Patient Active Problem List   Diagnosis Date Noted  . 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 Bacteremia   . PID (acute pelvic inflammatory disease) 11/20/2014  . Migraine headache with aura 11/20/2014  . Trichomonal cervicitis 11/20/2014  . Nausea and vomiting 11/19/2014  . Strep pharyngitis 11/19/2014  . SOB (shortness of breath) 02/17/2011  . Bruises easily/rash 02/17/2011  . Nausea 02/17/2011  . Poor appetite 02/17/2011  . Abdominal pain 02/17/2011  . Joint pain 02/17/2011   Objective:    BP 118/73 mmHg  Pulse 78  LMP 04/23/2015 (Approximate) FHT:  150 BPM  Uterine Size: 37 cm and size greater than dates  Presentation: cephalic     Assessment:    Pregnancy @ [redacted]w[redacted]d weeks   Plan:     labs reviewed, problem list updated Consent signed. GBS + results reviewed TDAP offered  Rhogam given for RH negative Pediatrician: discussed. Infant feeding: plans to breastfeed. Maternity leave: discussed. Cigarette smoking: never smoked. No orders of the defined types were placed in this encounter.   No orders of the defined types were placed in this encounter.   Follow up in 1 Week.

## 2016-01-18 ENCOUNTER — Other Ambulatory Visit: Payer: Self-pay | Admitting: Certified Nurse Midwife

## 2016-01-19 LAB — URINE CULTURE, OB REFLEX

## 2016-01-19 LAB — CULTURE, OB URINE

## 2016-01-19 LAB — OB RESULTS CONSOLE GBS: GBS: POSITIVE

## 2016-01-22 ENCOUNTER — Other Ambulatory Visit: Payer: Self-pay | Admitting: Certified Nurse Midwife

## 2016-01-22 ENCOUNTER — Ambulatory Visit (INDEPENDENT_AMBULATORY_CARE_PROVIDER_SITE_OTHER): Payer: Medicaid Other | Admitting: Obstetrics

## 2016-01-22 ENCOUNTER — Other Ambulatory Visit: Payer: Self-pay | Admitting: *Deleted

## 2016-01-22 VITALS — BP 123/77 | HR 79 | Wt 198.0 lb

## 2016-01-22 DIAGNOSIS — K219 Gastro-esophageal reflux disease without esophagitis: Secondary | ICD-10-CM

## 2016-01-22 DIAGNOSIS — K029 Dental caries, unspecified: Secondary | ICD-10-CM

## 2016-01-22 DIAGNOSIS — Z3493 Encounter for supervision of normal pregnancy, unspecified, third trimester: Secondary | ICD-10-CM

## 2016-01-22 LAB — POCT URINALYSIS DIPSTICK
BILIRUBIN UA: NEGATIVE
GLUCOSE UA: NEGATIVE
Leukocytes, UA: NEGATIVE
NITRITE UA: NEGATIVE
Protein, UA: NEGATIVE
RBC UA: NEGATIVE
Spec Grav, UA: 1.015
UROBILINOGEN UA: NEGATIVE
pH, UA: 6

## 2016-01-22 MED ORDER — OXYCODONE HCL 10 MG PO TABS: 10.0000 mg | ORAL_TABLET | Freq: Four times a day (QID) | ORAL | Status: DC | PRN

## 2016-01-22 MED ORDER — OMEPRAZOLE 20 MG PO CPDR: 20.0000 mg | DELAYED_RELEASE_CAPSULE | Freq: Every day | ORAL | Status: DC

## 2016-01-23 ENCOUNTER — Encounter: Payer: Self-pay | Admitting: Obstetrics

## 2016-01-23 NOTE — Progress Notes (Signed)
Subjective:    [name] is a 25 y.o. female being seen today for her obstetrical visit. She is at [redacted]w[redacted]d gestation. Patient reports toothache and heartburn. Fetal movement: normal.  Problem List Items Addressed This Visit    None    Visit Diagnoses    Dental caries    -  Primary    Relevant Medications    Oxycodone HCl 10 MG TABS    GERD without esophagitis        Relevant Medications    omeprazole (PRILOSEC) 20 MG capsule    Prenatal care, third trimester        Relevant Orders    POCT urinalysis dipstick (Completed)      Patient Active Problem List   Diagnosis Date Noted  . 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 Bacteremia   . PID (acute pelvic inflammatory disease) 11/20/2014  . Migraine headache with aura 11/20/2014  . Trichomonal cervicitis 11/20/2014  . Nausea and vomiting 11/19/2014  . Strep pharyngitis 11/19/2014  . SOB (shortness of breath) 02/17/2011  . Bruises easily/rash 02/17/2011  . Nausea 02/17/2011  . Poor appetite 02/17/2011  . Abdominal pain 02/17/2011  . Joint pain 02/17/2011   Objective:    BP 123/77 mmHg  Pulse 79  Wt 198 lb (89.812 kg)  LMP 04/23/2015 (Approximate) FHT:  150 BPM  Uterine Size: size equals dates  Presentation: unsure     Assessment:    Pregnancy @ [redacted]w[redacted]d weeks   Plan:     labs reviewed, problem list updated Consent signed. GBS sent TDAP offered  Rhogam given for RH negative Pediatrician: discussed. Infant feeding: plans to breastfeed. Maternity leave: discussed. Cigarette smoking: former smoker. Orders Placed This Encounter  Procedures  . POCT urinalysis dipstick   Meds ordered this encounter  Medications  . Oxycodone HCl 10 MG TABS    Sig: Take 1 tablet (10 mg total) by mouth every 6 (six) hours as needed.    Dispense:  40 tablet    Refill:  0  . omeprazole (PRILOSEC) 20 MG capsule    Sig: Take 1 capsule (20 mg total) by mouth daily.    Dispense:  60 capsule   Refill:  5   Follow up in 1 Week.

## 2016-01-30 ENCOUNTER — Encounter: Payer: Medicaid Other | Admitting: Obstetrics

## 2016-02-04 ENCOUNTER — Encounter: Payer: Self-pay | Admitting: Obstetrics

## 2016-02-04 ENCOUNTER — Ambulatory Visit (INDEPENDENT_AMBULATORY_CARE_PROVIDER_SITE_OTHER): Payer: Medicaid Other | Admitting: Obstetrics

## 2016-02-04 VITALS — BP 122/74 | HR 80 | Wt 204.0 lb

## 2016-02-04 DIAGNOSIS — Z3493 Encounter for supervision of normal pregnancy, unspecified, third trimester: Secondary | ICD-10-CM

## 2016-02-04 NOTE — Progress Notes (Signed)
Subjective:    [name] is a 25 y.o. female being seen today for her obstetrical visit. She is at [redacted]w[redacted]d gestation. Patient reports occasional contractions. Fetal movement: normal.  Problem List Items Addressed This Visit    None     Patient Active Problem List   Diagnosis Date Noted  . 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 Bacteremia   . PID (acute pelvic inflammatory disease) 11/20/2014  . Migraine headache with aura 11/20/2014  . Trichomonal cervicitis 11/20/2014  . Nausea and vomiting 11/19/2014  . Strep pharyngitis 11/19/2014  . SOB (shortness of breath) 02/17/2011  . Bruises easily/rash 02/17/2011  . Nausea 02/17/2011  . Poor appetite 02/17/2011  . Abdominal pain 02/17/2011  . Joint pain 02/17/2011    Objective:    BP 122/74 mmHg  Pulse 80  Wt 204 lb (92.534 kg)  LMP 04/23/2015 (Approximate) FHT: 150 BPM  Uterine Size: size equals dates  Presentations: cephalic    Assessment:    Pregnancy @ [redacted]w[redacted]d weeks   Plan:   Plans for delivery: C/Section scheduled; labs reviewed; problem list updated Counseling: Consent signed. Infant feeding: plans to breastfeed. Cigarette smoking: former smoker. L&D discussion: symptoms of labor, discussed when to call, discussed what number to call, anesthetic/analgesic options reviewed and delivering clinician:  plans Physician. Postpartum supports and preparation: circumcision discussed and contraception plans discussed.  Follow up in 1 Week.

## 2016-02-05 ENCOUNTER — Encounter (HOSPITAL_COMMUNITY): Payer: Self-pay

## 2016-02-05 ENCOUNTER — Inpatient Hospital Stay (HOSPITAL_COMMUNITY)
Admission: AD | Admit: 2016-02-05 | Discharge: 2016-02-05 | Disposition: A | Discharge status: Home / Self Care | Payer: Medicaid Other | Source: Ambulatory Visit | Attending: Obstetrics | Admitting: Obstetrics

## 2016-02-05 DIAGNOSIS — Z3493 Encounter for supervision of normal pregnancy, unspecified, third trimester: Secondary | ICD-10-CM | POA: Diagnosis present

## 2016-02-05 LAB — POCT FERN TEST: POCT Fern Test: NEGATIVE

## 2016-02-05 NOTE — MAU Note (Signed)
Pain started last night around 7pm, Pelvis and back pain.

## 2016-02-06 ENCOUNTER — Telehealth (HOSPITAL_COMMUNITY): Payer: Self-pay | Admitting: *Deleted

## 2016-02-06 NOTE — Telephone Encounter (Signed)
Preadmission screen  

## 2016-02-07 ENCOUNTER — Telehealth (HOSPITAL_COMMUNITY): Payer: Self-pay | Admitting: *Deleted

## 2016-02-07 NOTE — Telephone Encounter (Signed)
Preadmission screen  

## 2016-02-10 ENCOUNTER — Encounter (HOSPITAL_COMMUNITY): Payer: Self-pay

## 2016-02-12 ENCOUNTER — Ambulatory Visit (INDEPENDENT_AMBULATORY_CARE_PROVIDER_SITE_OTHER): Payer: Medicaid Other | Admitting: Certified Nurse Midwife

## 2016-02-12 VITALS — BP 132/88 | HR 96 | Wt 204.0 lb

## 2016-02-12 DIAGNOSIS — Z3483 Encounter for supervision of other normal pregnancy, third trimester: Secondary | ICD-10-CM

## 2016-02-12 LAB — POCT URINALYSIS DIPSTICK
Bilirubin, UA: NEGATIVE
Glucose, UA: NEGATIVE
Ketones, UA: NEGATIVE
Leukocytes, UA: NEGATIVE
Nitrite, UA: NEGATIVE
PROTEIN UA: NEGATIVE
RBC UA: NEGATIVE
UROBILINOGEN UA: NEGATIVE
pH, UA: 7.5

## 2016-02-12 NOTE — Progress Notes (Signed)
Subjective:    [name] is a 25 y.o. female being seen today for her obstetrical visit. She is at [redacted]w[redacted]d gestation. Patient reports no complaints. Fetal movement: normal.  Problem List Items Addressed This Visit    None     Patient Active Problem List   Diagnosis Date Noted  . 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 Bacteremia   . PID (acute pelvic inflammatory disease) 11/20/2014  . Migraine headache with aura 11/20/2014  . Trichomonal cervicitis 11/20/2014  . Nausea and vomiting 11/19/2014  . Strep pharyngitis 11/19/2014  . SOB (shortness of breath) 02/17/2011  . Bruises easily/rash 02/17/2011  . Nausea 02/17/2011  . Poor appetite 02/17/2011  . Abdominal pain 02/17/2011  . Joint pain 02/17/2011    Objective:    BP 132/88 mmHg  Pulse 96  Wt 204 lb (92.534 kg)  LMP 04/23/2015 (Approximate) FHT: 150 BPM  Uterine Size: size equals dates  Presentations: cephalic  Pelvic Exam: deferred     Assessment:    Pregnancy @ [redacted]w[redacted]d weeks   Repeat C-section  Plan:   Plans for delivery: C/Section scheduled for Friday the 7th of July; labs reviewed; problem list updated Counseling: Consent signed. Infant feeding: plans to breastfeed. Cigarette smoking: quit at start of pregnancy. L&D discussion: symptoms of labor, discussed when to call, discussed what number to call, anesthetic/analgesic options reviewed and delivering clinician:  plans Physician. Postpartum supports and preparation: circumcision discussed and contraception plans discussed.  Follow up in 1-2 weeks postpartum.

## 2016-02-12 NOTE — Addendum Note (Signed)
Addended by: Lewie Loron D on: 02/12/2016 05:13 PM   Modules accepted: Orders

## 2016-02-13 ENCOUNTER — Encounter (HOSPITAL_COMMUNITY)
Admission: RE | Admit: 2016-02-13 | Discharge: 2016-02-13 | Disposition: A | Discharge status: Home / Self Care | Payer: Medicaid Other | Source: Ambulatory Visit | Attending: Obstetrics | Admitting: Obstetrics

## 2016-02-13 LAB — CBC
HCT: 29.9 % — ABNORMAL LOW (ref 36.0–46.0)
Hemoglobin: 10 g/dL — ABNORMAL LOW (ref 12.0–15.0)
MCH: 30 pg (ref 26.0–34.0)
MCHC: 33.4 g/dL (ref 30.0–36.0)
MCV: 89.8 fL (ref 78.0–100.0)
Platelets: 322 10*3/uL (ref 150–400)
RBC: 3.33 MIL/uL — ABNORMAL LOW (ref 3.87–5.11)
RDW: 13.1 % (ref 11.5–15.5)
WBC: 11.6 10*3/uL — ABNORMAL HIGH (ref 4.0–10.5)

## 2016-02-13 MED ORDER — GENTAMICIN SULFATE 40 MG/ML IJ SOLN
INTRAVENOUS | Status: AC
  Administered 2016-02-14: 100 mL via INTRAVENOUS
  Filled 2016-02-13: qty 8.5

## 2016-02-13 NOTE — Patient Instructions (Signed)
22 [name]  02/13/2016   Your procedure is scheduled on:  02/14/2016  Enter through the Main Entrance of [facility] at [place] up the phone at the desk and dial [phone].   Call this number if you have problems the morning of surgery: [phone]   Remember:   Do not eat food:After Midnight.  Do not drink clear liquids: After Midnight.  Take these medicines the morning of surgery with A SIP OF WATER: none   Do not wear jewelry, make-up or nail polish.  Do not wear lotions, powders, or perfumes. You may wear deodorant.  Do not shave 48 hours prior to surgery.  Do not bring valuables to the hospital.  [facility] is not   responsible for any belongings or valuables brought to the hospital.  Contacts, dentures or bridgework may not be worn into surgery.  Leave suitcase in the car. After surgery it may be brought to your room.  For patients admitted to the hospital, checkout time is 11:00 AM the day of              discharge.   Patients discharged the day of surgery will not be allowed to drive             home.  Name and phone number of your driver: na  Special Instructions:   N/A   Please read over the following fact sheets that you were given:   Surgical Site Infection Prevention

## 2016-02-14 ENCOUNTER — Inpatient Hospital Stay (HOSPITAL_COMMUNITY)
Admission: RE | Admit: 2016-02-14 | Discharge: 2016-02-16 | DRG: 765 | Disposition: A | Discharge status: Home / Self Care | Payer: Medicaid Other | Source: Ambulatory Visit | Attending: Obstetrics | Admitting: Obstetrics

## 2016-02-14 ENCOUNTER — Inpatient Hospital Stay (HOSPITAL_COMMUNITY): Payer: Medicaid Other | Admitting: Anesthesiology

## 2016-02-14 ENCOUNTER — Encounter (HOSPITAL_COMMUNITY)
Admission: RE | Disposition: A | Discharge status: Home / Self Care | Payer: Self-pay | Source: Ambulatory Visit | Attending: Obstetrics

## 2016-02-14 ENCOUNTER — Encounter (HOSPITAL_COMMUNITY): Payer: Self-pay | Admitting: *Deleted

## 2016-02-14 DIAGNOSIS — Z8261 Family history of arthritis: Secondary | ICD-10-CM | POA: Diagnosis not present

## 2016-02-14 DIAGNOSIS — Z825 Family history of asthma and other chronic lower respiratory diseases: Secondary | ICD-10-CM

## 2016-02-14 DIAGNOSIS — Z3A39 39 weeks gestation of pregnancy: Secondary | ICD-10-CM

## 2016-02-14 DIAGNOSIS — O99824 Streptococcus B carrier state complicating childbirth: Secondary | ICD-10-CM | POA: Diagnosis present

## 2016-02-14 DIAGNOSIS — O9962 Diseases of the digestive system complicating childbirth: Secondary | ICD-10-CM | POA: Diagnosis present

## 2016-02-14 DIAGNOSIS — O34211 Maternal care for low transverse scar from previous cesarean delivery: Principal | ICD-10-CM | POA: Diagnosis present

## 2016-02-14 DIAGNOSIS — O99344 Other mental disorders complicating childbirth: Secondary | ICD-10-CM | POA: Diagnosis present

## 2016-02-14 DIAGNOSIS — D6862 Lupus anticoagulant syndrome: Secondary | ICD-10-CM | POA: Diagnosis present

## 2016-02-14 DIAGNOSIS — K219 Gastro-esophageal reflux disease without esophagitis: Secondary | ICD-10-CM | POA: Diagnosis present

## 2016-02-14 DIAGNOSIS — O9912 Other diseases of the blood and blood-forming organs and certain disorders involving the immune mechanism complicating childbirth: Secondary | ICD-10-CM | POA: Diagnosis present

## 2016-02-14 DIAGNOSIS — D649 Anemia, unspecified: Secondary | ICD-10-CM | POA: Diagnosis present

## 2016-02-14 DIAGNOSIS — O9902 Anemia complicating childbirth: Secondary | ICD-10-CM | POA: Diagnosis present

## 2016-02-14 DIAGNOSIS — Z833 Family history of diabetes mellitus: Secondary | ICD-10-CM

## 2016-02-14 DIAGNOSIS — Z87891 Personal history of nicotine dependence: Secondary | ICD-10-CM

## 2016-02-14 DIAGNOSIS — F418 Other specified anxiety disorders: Secondary | ICD-10-CM | POA: Diagnosis present

## 2016-02-14 DIAGNOSIS — Z98891 History of uterine scar from previous surgery: Secondary | ICD-10-CM

## 2016-02-14 LAB — RPR: RPR: NONREACTIVE

## 2016-02-14 LAB — PREPARE RBC (CROSSMATCH)

## 2016-02-14 SURGERY — Surgical Case
Anesthesia: Spinal | Site: Abdomen

## 2016-02-14 MED ORDER — OXYTOCIN 10 UNIT/ML IJ SOLN
INTRAMUSCULAR | Status: AC
  Filled 2016-02-14: qty 4

## 2016-02-14 MED ORDER — SCOPOLAMINE 1 MG/3DAYS TD PT72
MEDICATED_PATCH | TRANSDERMAL | Status: AC
  Administered 2016-02-14: 1.5 mg via TRANSDERMAL
  Filled 2016-02-14: qty 1

## 2016-02-14 MED ORDER — DIBUCAINE 1 % RE OINT: 1.0000 "application " | TOPICAL_OINTMENT | RECTAL | Status: DC | PRN

## 2016-02-14 MED ORDER — LACTATED RINGERS IV SOLN
INTRAVENOUS | Status: DC | PRN
  Administered 2016-02-14 (×2): via INTRAVENOUS

## 2016-02-14 MED ORDER — FENTANYL CITRATE (PF) 100 MCG/2ML IJ SOLN
INTRAMUSCULAR | Status: AC
  Filled 2016-02-14: qty 2

## 2016-02-14 MED ORDER — FENTANYL CITRATE (PF) 100 MCG/2ML IJ SOLN
INTRAMUSCULAR | Status: DC | PRN
  Administered 2016-02-14: 10 ug via INTRATHECAL

## 2016-02-14 MED ORDER — OXYTOCIN 40 UNITS IN LACTATED RINGERS INFUSION - SIMPLE MED: 2.5000 [IU]/h | INTRAVENOUS | Status: AC

## 2016-02-14 MED ORDER — KETOROLAC TROMETHAMINE 30 MG/ML IJ SOLN
30.0000 mg | Freq: Four times a day (QID) | INTRAMUSCULAR | Status: AC | PRN
  Administered 2016-02-14: 30 mg via INTRAMUSCULAR

## 2016-02-14 MED ORDER — OXYTOCIN 10 UNIT/ML IJ SOLN
40.0000 [IU] | INTRAVENOUS | Status: DC | PRN
  Administered 2016-02-14: 40 [IU] via INTRAVENOUS

## 2016-02-14 MED ORDER — SIMETHICONE 80 MG PO CHEW
80.0000 mg | CHEWABLE_TABLET | Freq: Three times a day (TID) | ORAL | Status: DC
  Administered 2016-02-15 – 2016-02-16 (×4): 80 mg via ORAL
  Filled 2016-02-14 (×4): qty 1

## 2016-02-14 MED ORDER — PRENATAL MULTIVITAMIN CH
1.0000 | ORAL_TABLET | Freq: Every day | ORAL | Status: DC
  Administered 2016-02-15: 1 via ORAL
  Filled 2016-02-14: qty 1

## 2016-02-14 MED ORDER — NALBUPHINE HCL 10 MG/ML IJ SOLN: 5.0000 mg | Freq: Once | INTRAMUSCULAR | Status: DC | PRN

## 2016-02-14 MED ORDER — NALOXONE HCL 2 MG/2ML IJ SOSY: 1.0000 ug/kg/h | PREFILLED_SYRINGE | INTRAVENOUS | Status: DC | PRN

## 2016-02-14 MED ORDER — ACETAMINOPHEN 500 MG PO TABS
1000.0000 mg | ORAL_TABLET | Freq: Four times a day (QID) | ORAL | Status: AC
  Administered 2016-02-14 – 2016-02-15 (×2): 1000 mg via ORAL
  Filled 2016-02-14 (×2): qty 2

## 2016-02-14 MED ORDER — BUPIVACAINE IN DEXTROSE 0.75-8.25 % IT SOLN
INTRATHECAL | Status: DC | PRN
  Administered 2016-02-14: 1.6 mL via INTRATHECAL

## 2016-02-14 MED ORDER — FENTANYL CITRATE (PF) 100 MCG/2ML IJ SOLN
25.0000 ug | Freq: Once | INTRAMUSCULAR | Status: AC
  Administered 2016-02-14: 25 ug via INTRAVENOUS
  Filled 2016-02-14: qty 2

## 2016-02-14 MED ORDER — SENNOSIDES-DOCUSATE SODIUM 8.6-50 MG PO TABS
2.0000 | ORAL_TABLET | ORAL | Status: DC
  Administered 2016-02-15 – 2016-02-16 (×2): 2 via ORAL
  Filled 2016-02-14 (×2): qty 2

## 2016-02-14 MED ORDER — LACTATED RINGERS IV SOLN
INTRAVENOUS | Status: DC
  Administered 2016-02-14: 12:00:00 via INTRAVENOUS

## 2016-02-14 MED ORDER — NALOXONE HCL 0.4 MG/ML IJ SOLN: 0.4000 mg | INTRAMUSCULAR | Status: DC | PRN

## 2016-02-14 MED ORDER — MEPERIDINE HCL 25 MG/ML IJ SOLN: 6.2500 mg | INTRAMUSCULAR | Status: DC | PRN

## 2016-02-14 MED ORDER — METHYLERGONOVINE MALEATE 0.2 MG/ML IJ SOLN: 0.2000 mg | INTRAMUSCULAR | Status: DC | PRN

## 2016-02-14 MED ORDER — TETANUS-DIPHTH-ACELL PERTUSSIS 5-2.5-18.5 LF-MCG/0.5 IM SUSP
0.5000 mL | Freq: Once | INTRAMUSCULAR | Status: AC
  Administered 2016-02-15: 0.5 mL via INTRAMUSCULAR
  Filled 2016-02-14: qty 0.5

## 2016-02-14 MED ORDER — PHENYLEPHRINE 8 MG IN D5W 100 ML (0.08MG/ML) PREMIX OPTIME
INJECTION | INTRAVENOUS | Status: AC
  Filled 2016-02-14: qty 100

## 2016-02-14 MED ORDER — DIPHENHYDRAMINE HCL 25 MG PO CAPS
25.0000 mg | ORAL_CAPSULE | Freq: Four times a day (QID) | ORAL | Status: DC | PRN
Start: 2016-02-14 — End: 2016-02-16

## 2016-02-14 MED ORDER — FENTANYL CITRATE (PF) 100 MCG/2ML IJ SOLN: 25.0000 ug | INTRAMUSCULAR | Status: DC | PRN

## 2016-02-14 MED ORDER — ONDANSETRON HCL 4 MG/2ML IJ SOLN
4.0000 mg | Freq: Three times a day (TID) | INTRAMUSCULAR | Status: DC | PRN
  Administered 2016-02-14: 4 mg via INTRAVENOUS
  Filled 2016-02-14: qty 2

## 2016-02-14 MED ORDER — PHENYLEPHRINE 40 MCG/ML (10ML) SYRINGE FOR IV PUSH (FOR BLOOD PRESSURE SUPPORT)
PREFILLED_SYRINGE | INTRAVENOUS | Status: AC
  Filled 2016-02-14: qty 10

## 2016-02-14 MED ORDER — DIPHENHYDRAMINE HCL 50 MG/ML IJ SOLN
INTRAMUSCULAR | Status: DC | PRN
  Administered 2016-02-14: 25 mg via INTRAVENOUS

## 2016-02-14 MED ORDER — MORPHINE SULFATE (PF) 0.5 MG/ML IJ SOLN
INTRAMUSCULAR | Status: DC | PRN
  Administered 2016-02-14: .2 mg via INTRATHECAL

## 2016-02-14 MED ORDER — SIMETHICONE 80 MG PO CHEW
80.0000 mg | CHEWABLE_TABLET | ORAL | Status: DC
  Administered 2016-02-15 – 2016-02-16 (×2): 80 mg via ORAL
  Filled 2016-02-14 (×2): qty 1

## 2016-02-14 MED ORDER — SODIUM CHLORIDE 0.9% FLUSH: 3.0000 mL | INTRAVENOUS | Status: DC | PRN

## 2016-02-14 MED ORDER — IBUPROFEN 600 MG PO TABS
600.0000 mg | ORAL_TABLET | Freq: Four times a day (QID) | ORAL | Status: DC
  Administered 2016-02-15 – 2016-02-16 (×6): 600 mg via ORAL
  Filled 2016-02-14 (×6): qty 1

## 2016-02-14 MED ORDER — DIPHENHYDRAMINE HCL 50 MG/ML IJ SOLN
INTRAMUSCULAR | Status: AC
  Filled 2016-02-14: qty 1

## 2016-02-14 MED ORDER — EPHEDRINE 5 MG/ML INJ
INTRAVENOUS | Status: AC
  Filled 2016-02-14: qty 10

## 2016-02-14 MED ORDER — NALBUPHINE HCL 10 MG/ML IJ SOLN: 5.0000 mg | INTRAMUSCULAR | Status: DC | PRN

## 2016-02-14 MED ORDER — KETOROLAC TROMETHAMINE 30 MG/ML IJ SOLN
INTRAMUSCULAR | Status: AC
  Filled 2016-02-14: qty 1

## 2016-02-14 MED ORDER — METHYLERGONOVINE MALEATE 0.2 MG PO TABS: 0.2000 mg | ORAL_TABLET | ORAL | Status: DC | PRN

## 2016-02-14 MED ORDER — MEDROXYPROGESTERONE ACETATE 150 MG/ML IM SUSP: 150.0000 mg | INTRAMUSCULAR | Status: DC | PRN

## 2016-02-14 MED ORDER — COCONUT OIL OIL: 1.0000 "application " | TOPICAL_OIL | Status: DC | PRN

## 2016-02-14 MED ORDER — PHENYLEPHRINE 8 MG IN D5W 100 ML (0.08MG/ML) PREMIX OPTIME
INJECTION | INTRAVENOUS | Status: DC | PRN
  Administered 2016-02-14: 60 ug/min via INTRAVENOUS

## 2016-02-14 MED ORDER — KETOROLAC TROMETHAMINE 30 MG/ML IJ SOLN: 30.0000 mg | Freq: Four times a day (QID) | INTRAMUSCULAR | Status: AC | PRN

## 2016-02-14 MED ORDER — ONDANSETRON HCL 4 MG/2ML IJ SOLN
INTRAMUSCULAR | Status: AC
  Filled 2016-02-14: qty 2

## 2016-02-14 MED ORDER — DIPHENHYDRAMINE HCL 50 MG/ML IJ SOLN: 12.5000 mg | INTRAMUSCULAR | Status: DC | PRN

## 2016-02-14 MED ORDER — OXYCODONE-ACETAMINOPHEN 5-325 MG PO TABS
1.0000 | ORAL_TABLET | ORAL | Status: DC | PRN
  Administered 2016-02-15: 1 via ORAL
  Filled 2016-02-14: qty 1

## 2016-02-14 MED ORDER — SODIUM CHLORIDE 0.9 % IR SOLN
Status: DC | PRN
  Administered 2016-02-14: 1000 mL

## 2016-02-14 MED ORDER — WITCH HAZEL-GLYCERIN EX PADS: 1.0000 "application " | MEDICATED_PAD | CUTANEOUS | Status: DC | PRN

## 2016-02-14 MED ORDER — OXYCODONE-ACETAMINOPHEN 5-325 MG PO TABS
2.0000 | ORAL_TABLET | ORAL | Status: DC | PRN
  Administered 2016-02-15 – 2016-02-16 (×5): 2 via ORAL
  Filled 2016-02-14 (×5): qty 2

## 2016-02-14 MED ORDER — MENTHOL 3 MG MT LOZG: 1.0000 | LOZENGE | OROMUCOSAL | Status: DC | PRN

## 2016-02-14 MED ORDER — ACETAMINOPHEN 325 MG PO TABS: 650.0000 mg | ORAL_TABLET | ORAL | Status: DC | PRN

## 2016-02-14 MED ORDER — SIMETHICONE 80 MG PO CHEW: 80.0000 mg | CHEWABLE_TABLET | ORAL | Status: DC | PRN

## 2016-02-14 MED ORDER — NALBUPHINE HCL 10 MG/ML IJ SOLN
5.0000 mg | INTRAMUSCULAR | Status: DC | PRN
  Administered 2016-02-14: 5 mg via INTRAVENOUS
  Filled 2016-02-14: qty 1

## 2016-02-14 MED ORDER — DEXAMETHASONE SODIUM PHOSPHATE 4 MG/ML IJ SOLN
INTRAMUSCULAR | Status: DC | PRN
  Administered 2016-02-14: 10 mg via INTRAVENOUS

## 2016-02-14 MED ORDER — MORPHINE SULFATE (PF) 0.5 MG/ML IJ SOLN
INTRAMUSCULAR | Status: AC
  Filled 2016-02-14: qty 10

## 2016-02-14 MED ORDER — SCOPOLAMINE 1 MG/3DAYS TD PT72
1.0000 | MEDICATED_PATCH | Freq: Once | TRANSDERMAL | Status: DC
  Administered 2016-02-14: 1.5 mg via TRANSDERMAL

## 2016-02-14 MED ORDER — PROMETHAZINE HCL 25 MG/ML IJ SOLN: 6.2500 mg | INTRAMUSCULAR | Status: DC | PRN

## 2016-02-14 MED ORDER — DIPHENHYDRAMINE HCL 25 MG PO CAPS
25.0000 mg | ORAL_CAPSULE | ORAL | Status: DC | PRN
  Filled 2016-02-14: qty 1

## 2016-02-14 MED ORDER — LACTATED RINGERS IV SOLN
INTRAVENOUS | Status: DC
  Administered 2016-02-15: 02:00:00 via INTRAVENOUS

## 2016-02-14 MED ORDER — LACTATED RINGERS IV SOLN
Freq: Once | INTRAVENOUS | Status: AC
  Administered 2016-02-14: 11:00:00 via INTRAVENOUS

## 2016-02-14 MED ORDER — ZOLPIDEM TARTRATE 5 MG PO TABS: 5.0000 mg | ORAL_TABLET | Freq: Every evening | ORAL | Status: DC | PRN

## 2016-02-14 MED ORDER — ONDANSETRON HCL 4 MG/2ML IJ SOLN
INTRAMUSCULAR | Status: DC | PRN
  Administered 2016-02-14: 4 mg via INTRAVENOUS

## 2016-02-14 SURGICAL SUPPLY — 38 items
APL SKNCLS STERI-STRIP NONHPOA (GAUZE/BANDAGES/DRESSINGS) ×1
BENZOIN TINCTURE PRP APPL 2/3 (GAUZE/BANDAGES/DRESSINGS) ×2 IMPLANT
CHLORAPREP W/TINT 26ML (MISCELLANEOUS) ×2 IMPLANT
CLAMP CORD UMBIL (MISCELLANEOUS) ×2 IMPLANT
CLOSURE WOUND 1/2 X4 (GAUZE/BANDAGES/DRESSINGS) ×1
CLOTH BEACON ORANGE TIMEOUT ST (SAFETY) ×3 IMPLANT
DRSG OPSITE POSTOP 4X10 (GAUZE/BANDAGES/DRESSINGS) ×3 IMPLANT
ELECT REM PT RETURN 9FT ADLT (ELECTROSURGICAL) ×3
ELECTRODE REM PT RTRN 9FT ADLT (ELECTROSURGICAL) ×1 IMPLANT
EXTENDER TRAXI PANNICULUS (MISCELLANEOUS) IMPLANT
EXTRACTOR VACUUM M CUP 4 TUBE (SUCTIONS) ×1 IMPLANT
EXTRACTOR VACUUM M CUP 4' TUBE (SUCTIONS) ×1
GLOVE BIO SURGEON STRL SZ8 (GLOVE) ×3 IMPLANT
GLOVE BIOGEL PI IND STRL 7.0 (GLOVE) ×1 IMPLANT
GLOVE BIOGEL PI INDICATOR 7.0 (GLOVE) ×2
GOWN STRL REUS W/TWL LRG LVL3 (GOWN DISPOSABLE) ×6 IMPLANT
NDL HYPO 25X5/8 SAFETYGLIDE (NEEDLE) ×1 IMPLANT
NEEDLE HYPO 22GX1.5 SAFETY (NEEDLE) ×3 IMPLANT
NEEDLE HYPO 25X5/8 SAFETYGLIDE (NEEDLE) ×3 IMPLANT
NS IRRIG 1000ML POUR BTL (IV SOLUTION) ×3 IMPLANT
PACK C SECTION WH (CUSTOM PROCEDURE TRAY) ×3 IMPLANT
PAD OB MATERNITY 4.3X12.25 (PERSONAL CARE ITEMS) ×3 IMPLANT
PENCIL SMOKE EVAC W/HOLSTER (ELECTROSURGICAL) ×3 IMPLANT
RTRCTR C-SECT PINK 25CM LRG (MISCELLANEOUS) ×3 IMPLANT
STRIP CLOSURE SKIN 1/2X4 (GAUZE/BANDAGES/DRESSINGS) ×1 IMPLANT
SUT MNCRL 0 VIOLET CTX 36 (SUTURE) ×3 IMPLANT
SUT MON AB 2-0 CT1 27 (SUTURE) ×3 IMPLANT
SUT MONOCRYL 0 CTX 36 (SUTURE) ×6
SUT PLAIN 2 0 XLH (SUTURE) ×2 IMPLANT
SUT VIC AB 0 CTX 36 (SUTURE) ×6
SUT VIC AB 0 CTX36XBRD ANBCTRL (SUTURE) ×2 IMPLANT
SUT VIC AB 2-0 CT1 27 (SUTURE) ×3
SUT VIC AB 2-0 CT1 TAPERPNT 27 (SUTURE) IMPLANT
SUT VIC AB 4-0 KS 27 (SUTURE) ×4 IMPLANT
SYR CONTROL 10ML LL (SYRINGE) ×3 IMPLANT
TOWEL OR 17X24 6PK STRL BLUE (TOWEL DISPOSABLE) ×3 IMPLANT
TRAXI PANNICULUS EXTENDER (MISCELLANEOUS) ×2
TRAY FOLEY CATH SILVER 14FR (SET/KITS/TRAYS/PACK) ×3 IMPLANT
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
## 2016-02-14 NOTE — Op Note (Signed)
Cesarean Section Procedure Note   [name]   02/14/2016  Indications: Scheduled Proceedure/Maternal Request   Pre-operative Diagnosis: REPEAT Cesarean Section.   Post-operative Diagnosis: Same   Surgeon: Baltazar Najjar A  Assistants: Claretta Fraise RN, RNFA   Anesthesia: spinal  Procedure Details:  The patient was seen in the Holding Room. The risks, benefits, complications, treatment options, and expected outcomes were discussed with the patient. The patient concurred with the proposed plan, giving informed consent. The patient was identified as [name] and the procedure verified as C-Section Delivery. A Time Out was held and the above information confirmed.  After induction of anesthesia, the patient was draped and prepped in the usual sterile manner. A transverse incision was made and carried down through the subcutaneous tissue to the fascia. The fascial incision was made and extended transversely. The fascia was separated from the underlying rectus tissue superiorly and inferiorly. The peritoneum was identified and entered. The peritoneal incision was extended longitudinally. The utero-vesical peritoneal reflection was incised transversely and the bladder flap was bluntly freed from the lower uterine segment. A low transverse uterine incision was made. Delivered from cephalic presentation was a 3355 gram living newborn female infant(s). APGAR (1 MIN): 8   APGAR (5 MINS): 9   APGAR (10 MINS):    A cord ph was not sent. The umbilical cord was clamped and cut cord. A sample was obtained for evaluation. The placenta was removed Intact and appeared normal.  The uterine incision was closed with running locked sutures of 0 Monocryl.  Hemostasis was observed. The paracolic gutters were irrigated. The parieto peritoneum was closed in a running fashion with 2-0 Vicryl.  The fascia was then reapproximated with running sutures of 0 Vicryl.  The subcutaneous layer closed with 2-0 plain  catgut. The skin was closed with suture.  Instrument, sponge, and needle counts were correct prior the abdominal closure and were correct at the conclusion of the case.    Findings:  Normal uterus, ovaries and tubes   Estimated Blood Loss: 888ml   Total IV Fluids: 1458ml   Urine Output: 150CC OF clear urine  Specimen:  None  Complications: no complications  Disposition: PACU - hemodynamically stable.  Maternal Condition: stable   Baby condition / location:  Couplet care / Skin to Skin    Signed: Surgeon(s): Shelly Bombard, MD

## 2016-02-14 NOTE — Lactation Note (Signed)
This note was copied from a baby's chart. Lactation Consultation Note  Patient Name: [name] [id number] Date: 02/14/2016 Reason for consult: Initial assessment Baby at 4 hr of life. Mom bf her older child for 3 days in the hospital until she had a "nipple flare up". She reports her nipples, not her breast, got very large and baby was having a hard time latching. She tried to pump to feed but had to be readmitted to the hospital "several" times. She was not able to keep up with pumping because of her "health problems". Discussed baby behavior, feeding frequency, voids, wt loss, breast changes, and nipple care. She stated she can manually express and has a spoon in the room. Given lactation handouts. Aware of OP services and support group.    Maternal Data Has patient been taught Hand Expression?: Yes Does the patient have breastfeeding experience prior to this delivery?: Yes  Feeding Feeding Type: Breast Fed  LATCH Score/Interventions Latch: Grasps breast easily, tongue down, lips flanged, rhythmical sucking.  Audible Swallowing: A few with stimulation Intervention(s): Skin to skin  Type of Nipple: Everted at rest and after stimulation  Comfort (Breast/Nipple): Soft / non-tender     Hold (Positioning): Full assist, staff holds infant at breast  LATCH Score: 7  Lactation Tools Discussed/Used WIC Program: Yes   Consult Status Consult Status: Follow-up Date: 02/15/16 Follow-up type: In-patient    Denzil Hughes 02/14/2016, 4:34 PM

## 2016-02-14 NOTE — Consult Note (Signed)
Neonatology Note:   Attendance at C-section:    I was asked by Dr. Jodi Mourning to attend this repeat C/S at term. The mother is a G2P1 25yo, GBS positive with good prenatal care. PMHx: Lupus, fibromyalgia, asthma, depression, RA, anxiety.  Meds: flexeril, Diflucan, prilosec, zofran, oxycodone, and PNV.  ROM 0 hours before delivery, fluid clear. Infant vigorous with good spontaneous cry and tone. Needed only minimal bulb suctioning. Ap 8/9. Lungs clear to ausc in DR. To CN to care of Pediatrician.  Monia Sabal Katherina Mires, MD

## 2016-02-14 NOTE — Transfer of Care (Signed)
Immediate Anesthesia Transfer of Care Note  Patient: [name]  Procedure(s) Performed: Procedure(s): CESAREAN SECTION (N/A)  Patient Location: PACU  Anesthesia Type:Spinal  Level of Consciousness: awake, alert  and oriented  Airway & Oxygen Therapy: Patient Spontanous Breathing  Post-op Assessment: Report given to RN and Post -op Vital signs reviewed and stable  Post vital signs: Reviewed and stable  Last Vitals:  Filed Vitals:   02/14/16 1042  BP: 117/71  Pulse: 83  Temp: 36.4 C  Resp: 18    Last Pain: There were no vitals filed for this visit.    Patients Stated Pain Goal: 2 ([id number])  Complications: No apparent anesthesia complications

## 2016-02-14 NOTE — Anesthesia Postprocedure Evaluation (Signed)
Anesthesia Post Note  Patient: [name]  Procedure(s) Performed: Procedure(s) (LRB): CESAREAN SECTION (N/A)  Patient location during evaluation: PACU Anesthesia Type: Spinal Level of consciousness: awake and alert, awake and oriented Pain management: pain level controlled Vital Signs Assessment: post-procedure vital signs reviewed and stable Respiratory status: spontaneous breathing, nonlabored ventilation, respiratory function stable and patient connected to nasal cannula oxygen Cardiovascular status: blood pressure returned to baseline and stable Postop Assessment: no signs of nausea or vomiting, spinal receding, patient able to bend at knees, no backache and no headache Anesthetic complications: no     Last Vitals:  Filed Vitals:   02/14/16 1545 02/14/16 1627  BP: 115/53 115/58  Pulse: 58 59  Temp: 36.4 C 36.4 C  Resp: 18 16    Last Pain:  Filed Vitals:   02/14/16 1738  PainSc: 8    Pain Goal: Patients Stated Pain Goal: 2 (02/14/16 1042)               Catalina Gravel

## 2016-02-14 NOTE — Anesthesia Procedure Notes (Signed)
Spinal Patient location during procedure: OR Staffing Anesthesiologist: Catalina Gravel Performed by: anesthesiologist  Preanesthetic Checklist Completed: patient identified, surgical consent, pre-op evaluation, timeout performed, IV checked, risks and benefits discussed and monitors and equipment checked Spinal Block Patient position: sitting Prep: site prepped and draped and DuraPrep Patient monitoring: continuous pulse ox and blood pressure Approach: midline Location: L3-4 Needle Needle type: Pencan  Needle gauge: 24 G Needle length: 9 cm Assessment Sensory level: T4 Additional Notes Functioning IV was confirmed and monitors were applied. Sterile prep and drape, including hand hygiene, mask and sterile gloves were used. The patient was positioned and the spine was prepped. The skin was anesthetized with lidocaine.  Free flow of clear CSF was obtained prior to injecting local anesthetic into the CSF.  The spinal needle aspirated freely following injection.  The needle was carefully withdrawn.  The patient tolerated the procedure well. Consent was obtained prior to procedure with all questions answered and concerns addressed. Risks including but not limited to bleeding, infection, nerve damage, paralysis, failed block, inadequate analgesia, allergic reaction, high spinal, itching and headache were discussed and the patient wished to proceed.   Hoy Morn, MD

## 2016-02-14 NOTE — Anesthesia Preprocedure Evaluation (Addendum)
Anesthesia Evaluation  Patient identified by MRN, date of birth, ID band Patient awake    Reviewed: Allergy & Precautions, NPO status , Patient's Chart, lab work & pertinent test results  Airway Mallampati: II  TM Distance: >3 FB Neck ROM: Full    Dental  (+) Teeth Intact, Dental Advisory Given   Pulmonary asthma , former smoker,  Chronic bronchitis   Pulmonary exam normal breath sounds clear to auscultation       Cardiovascular negative cardio ROS Normal cardiovascular exam Rhythm:Regular Rate:Normal     Neuro/Psych  Headaches, PSYCHIATRIC DISORDERS Anxiety Depression    GI/Hepatic Neg liver ROS, GERD  Medicated and Controlled,  Endo/Other  Obesity Lupus  Renal/GU negative Renal ROS     Musculoskeletal  (+) Arthritis , Rheumatoid disorders,  Fibromyalgia -, narcotic dependent  Abdominal   Peds  Hematology  (+) Blood dyscrasia, anemia , Plt 322k   Anesthesia Other Findings Day of surgery medications reviewed with the patient.  Reproductive/Obstetrics (+) Pregnancy H/o C-section                           Anesthesia Physical Anesthesia Plan  ASA: III  Anesthesia Plan: Spinal   Post-op Pain Management:    Induction:   Airway Management Planned:   Additional Equipment:   Intra-op Plan:   Post-operative Plan:   Informed Consent: I have reviewed the patients History and Physical, chart, labs and discussed the procedure including the risks, benefits and alternatives for the proposed anesthesia with the patient or authorized representative who has indicated his/her understanding and acceptance.   Dental advisory given  Plan Discussed with: CRNA, Anesthesiologist and Surgeon  Anesthesia Plan Comments: (Discussed risks and benefits of and differences between spinal and general. Discussed risks of spinal including headache, backache, failure, bleeding, infection, and nerve damage.  Patient consents to spinal. Questions answered. Coagulation studies and platelet count acceptable.)       Anesthesia Quick Evaluation

## 2016-02-15 LAB — CBC
HCT: 26 % — ABNORMAL LOW (ref 36.0–46.0)
HEMOGLOBIN: 8.7 g/dL — AB (ref 12.0–15.0)
MCH: 29.8 pg (ref 26.0–34.0)
MCHC: 33.5 g/dL (ref 30.0–36.0)
MCV: 89 fL (ref 78.0–100.0)
Platelets: 299 10*3/uL (ref 150–400)
RBC: 2.92 MIL/uL — AB (ref 3.87–5.11)
RDW: 13 % (ref 11.5–15.5)
WBC: 20.1 10*3/uL — AB (ref 4.0–10.5)

## 2016-02-15 MED ORDER — FERROUS SULFATE 325 (65 FE) MG PO TABS
325.0000 mg | ORAL_TABLET | Freq: Two times a day (BID) | ORAL | Status: DC
  Administered 2016-02-15 – 2016-02-16 (×3): 325 mg via ORAL
  Filled 2016-02-15 (×3): qty 1

## 2016-02-15 MED ORDER — PANTOPRAZOLE SODIUM 40 MG PO TBEC
40.0000 mg | DELAYED_RELEASE_TABLET | Freq: Every day | ORAL | Status: DC
  Administered 2016-02-15 – 2016-02-16 (×2): 40 mg via ORAL
  Filled 2016-02-15 (×3): qty 1

## 2016-02-15 MED ORDER — MEASLES, MUMPS & RUBELLA VAC ~~LOC~~ INJ
0.5000 mL | INJECTION | Freq: Once | SUBCUTANEOUS | Status: AC
  Administered 2016-02-16: 0.5 mL via SUBCUTANEOUS
  Filled 2016-02-15 (×2): qty 0.5

## 2016-02-15 NOTE — Lactation Note (Signed)
This note was copied from a baby's chart. Lactation Consultation Note Follow up visit at 34 hours of age.  Mom denies concerns or latch pain at this time.  Mom reports baby is latching well and she is using a pacifier.  LC updated feedings and output that appear adequate for age.  Mom plans d/c in the morning.  Mom to call for assist as needed.    Patient Name: [name] [id number] Date: 02/15/2016 Reason for consult: Follow-up assessment   Maternal Data    Feeding Feeding Type: Breast Fed Length of feed: 30 min  LATCH Score/Interventions                      Lactation Tools Discussed/Used     Consult Status Consult Status: Follow-up Date: 02/16/16 Follow-up type: In-patient    Justice Britain 02/15/2016, 10:48 PM
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
## 2016-02-15 NOTE — Progress Notes (Signed)
Subjective: Postpartum Day 1: Cesarean Delivery Eating, drinking, voiding, ambulating well.  +flatus.  Lochia and pain wnl.  Denies dizziness, lightheadedness, or sob. No complaints. Wants to be d/c'd early tomorrow.   Objective: Vital signs in last 24 hours: Temp:  [96.5 F (35.8 C)-97.8 F (36.6 C)] 97.8 F (36.6 C) (07/08 0800) Pulse Rate:  [58-83] 65 (07/08 0800) Resp:  [14-23] 16 (07/08 0800) BP: (107-126)/(53-76) 116/60 mmHg (07/08 0800) SpO2:  [96 %-100 %] 99 % (07/08 0800)  Physical Exam:  General: alert, cooperative and no distress Lochia: appropriate Uterine Fundus: firm Incision: healing well, no significant drainage, no dehiscence, no significant erythema DVT Evaluation: No evidence of DVT seen on physical exam. Negative Homan's sign. No cords or calf tenderness. No significant calf/ankle edema.   Recent Labs  02/13/16 1040 02/15/16 0604  HGB 10.0* 8.7*  HCT 29.9* 26.0*    Assessment/Plan: Status post Cesarean section. Doing well postoperatively.  Continue current care. Plan d/c tomorrow Breastfeeding Plans nexplanon OP circumcision  Tawnya Crook 02/15/2016, 9:38 AM

## 2016-02-16 LAB — TYPE AND SCREEN
ABO/RH(D): A POS
ANTIBODY SCREEN: NEGATIVE
UNIT DIVISION: 0
Unit division: 0

## 2016-02-16 MED ORDER — OXYCODONE-ACETAMINOPHEN 5-325 MG PO TABS: 2.0000 | ORAL_TABLET | ORAL | Status: DC | PRN

## 2016-02-16 MED ORDER — IBUPROFEN 600 MG PO TABS: 600.0000 mg | ORAL_TABLET | Freq: Four times a day (QID) | ORAL | Status: DC | PRN

## 2016-02-16 MED ORDER — FERROUS SULFATE 325 (65 FE) MG PO TABS: 325.0000 mg | ORAL_TABLET | Freq: Two times a day (BID) | ORAL | Status: DC

## 2016-02-16 NOTE — Discharge Summary (Signed)
OB Discharge Summary     Patient Name: [name] DOB: 1991/02/04 MRN: [id number]  Date of admission: 02/14/2016 Delivering MD: Baltazar Najjar A   Date of discharge: 02/16/2016  Admitting diagnosis: REPEAT CS Intrauterine pregnancy: [redacted]w[redacted]d     Secondary diagnosis:  Active Problems:   S/P cesarean section  Additional problems: none     Discharge diagnosis: Term Pregnancy Delivered and Anemia                                                                                                Post partum procedures:none  Augmentation: n/a  Complications: None  Hospital course:  Sceduled C/S   25 y.o. yo G2P2002 at [redacted]w[redacted]d was admitted to the hospital 02/14/2016 for scheduled cesarean section with the following indication:Elective Repeat.  Membrane Rupture Time/Date: 12:23 PM ,02/14/2016   Patient delivered a Viable infant.02/14/2016  Details of operation can be found in separate operative note.  Pateint had an uncomplicated postpartum course.  She is ambulating, tolerating a regular diet, passing flatus, and urinating well. Patient is discharged home in stable condition on  02/16/2016          Physical exam  Filed Vitals:   02/15/16 0500 02/15/16 0800 02/15/16 1821 02/16/16 0600  BP: 110/63 116/60 117/54 117/48  Pulse: 61 65 72 67  Temp: 97.8 F (36.6 C) 97.8 F (36.6 C) 98.4 F (36.9 C) 98.4 F (36.9 C)  TempSrc: Oral Oral Oral Oral  Resp: 18 16 16 20   Height:      Weight:      SpO2: 97% 99% 100%    General: alert, cooperative and no distress Lochia: appropriate Uterine Fundus: firm Incision: Healing well with no significant drainage, No significant erythema, Dressing is clean, dry, and intact DVT Evaluation: No evidence of DVT seen on physical exam. Negative Homan's sign. No cords or calf tenderness. No significant calf/ankle edema. Labs: Lab Results  Component Value Date   WBC 20.1* 02/15/2016   HGB 8.7* 02/15/2016   HCT 26.0* 02/15/2016   MCV 89.0 02/15/2016   PLT  299 02/15/2016   CMP Latest Ref Rng 09/27/2015  Glucose 65 - 99 mg/dL 75  BUN 6 - 20 mg/dL 6  Creatinine 0.44 - 1.00 mg/dL 0.46  Sodium 135 - 145 mmol/L 132(L)  Potassium 3.5 - 5.1 mmol/L 3.6  Chloride 101 - 111 mmol/L 103  CO2 22 - 32 mmol/L 20(L)  Calcium 8.9 - 10.3 mg/dL 8.2(L)  Total Protein 6.5 - 8.1 g/dL 6.7  Total Bilirubin 0.3 - 1.2 mg/dL 0.3  Alkaline Phos 38 - 126 U/L 55  AST 15 - 41 U/L 30  ALT 14 - 54 U/L 34    Discharge instruction: per After Visit Summary and "Baby and Me Booklet".  After visit meds:    Medication List    STOP taking these medications        cyclobenzaprine 10 MG tablet  Commonly known as:  FLEXERIL     fluconazole 150 MG tablet  Commonly known as:  DIFLUCAN     omeprazole 20 MG capsule  Commonly known  as:  PRILOSEC     ondansetron 4 MG tablet  Commonly known as:  ZOFRAN     Oxycodone HCl 10 MG Tabs      TAKE these medications        ferrous sulfate 325 (65 FE) MG tablet  Take 1 tablet (325 mg total) by mouth 2 (two) times daily with a meal.     ibuprofen 600 MG tablet  Commonly known as:  ADVIL,MOTRIN  Take 1 tablet (600 mg total) by mouth every 6 (six) hours as needed for mild pain, moderate pain or cramping.     VITAFOL GUMMIES 3.33-0.333-34.8 MG Chew  Chew 3 tablets by mouth daily.        Diet: routine diet  Activity: Advance as tolerated. Pelvic rest for 6 weeks.   Outpatient follow up:6 weeks Follow up Appt:No future appointments. Follow up Visit:No Follow-up on file.  Postpartum contraception: abstinence until nexplanon  Newborn Data: Live born female  Birth Weight: 7 lb 6.3 oz (3355 g) APGAR: 8, 9  Baby Feeding: Breast Disposition:home with mother  Outpatient circ   02/16/2016 Tawnya Crook, CNM

## 2016-02-16 NOTE — Discharge Instructions (Signed)
NO SEX UNTIL AFTER YOU GET YOUR BIRTH CONTROL   Cesarean Delivery, Care After Refer to this sheet in the next few weeks. These instructions provide you with information on caring for yourself after your procedure. Your health care provider may also give you specific instructions. Your treatment has been planned according to current medical practices, but problems sometimes occur. Call your health care provider if you have any problems or questions after you go home. HOME CARE INSTRUCTIONS  Only take over-the-counter or prescription medications as directed by your health care provider.  Do not drink alcohol, especially if you are breastfeeding or taking medication to relieve pain.  Do not chew or smoke tobacco.  Continue to use good perineal care. Good perineal care includes:  Wiping your perineum from front to back.  Keeping your perineum clean.  Check your surgical cut (incision) daily for increased redness, drainage, swelling, or separation of skin.  Clean your incision gently with soap and water every day, and then pat it dry. If your health care provider says it is okay, leave the incision uncovered. Use a bandage (dressing) if the incision is draining fluid or appears irritated. If the adhesive strips across the incision do not fall off within 7 days, carefully peel them off.  Hug a pillow when coughing or sneezing until your incision is healed. This helps to relieve pain.  Do not use tampons or douche until your health care provider says it is okay.  Shower, wash your hair, and take tub baths as directed by your health care provider.  Wear a well-fitting bra that provides breast support.  Limit wearing support panties or control-top hose.  Drink enough fluids to keep your urine clear or pale yellow.  Eat high-fiber foods such as whole grain cereals and breads, brown rice, beans, and fresh fruits and vegetables every day. These foods may help prevent or relieve  constipation.  Resume activities such as climbing stairs, driving, lifting, exercising, or traveling as directed by your health care provider.  Talk to your health care provider about resuming sexual activities. This is dependent upon your risk of infection, your rate of healing, and your comfort and desire to resume sexual activity.  Try to have someone help you with your household activities and your newborn for at least a few days after you leave the hospital.  Rest as much as possible. Try to rest or take a nap when your newborn is sleeping.  Increase your activities gradually.  Keep all of your scheduled postpartum appointments. It is very important to keep your scheduled follow-up appointments. At these appointments, your health care provider will be checking to make sure that you are healing physically and emotionally. SEEK MEDICAL CARE IF:   You are passing large clots from your vagina. Save any clots to show your health care provider.  You have a foul smelling discharge from your vagina.  You have trouble urinating.  You are urinating frequently.  You have pain when you urinate.  You have a change in your bowel movements.  You have increasing redness, pain, or swelling near your incision.  You have pus draining from your incision.  Your incision is separating.  You have painful, hard, or reddened breasts.  You have a severe headache.  You have blurred vision or see spots.  You feel sad or depressed.  You have thoughts of hurting yourself or your newborn.  You have questions about your care, the care of your newborn, or medications.  You  are dizzy or light-headed.  You have a rash.  You have pain, redness, or swelling at the site of the removed intravenous access (IV) tube.  You have nausea or vomiting.  You stopped breastfeeding and have not had a menstrual period within 12 weeks of stopping.  You are not breastfeeding and have not had a menstrual  period within 12 weeks of delivery.  You have a fever. SEEK IMMEDIATE MEDICAL CARE IF:  You have persistent pain.  You have chest pain.  You have shortness of breath.  You faint.  You have leg pain.  You have stomach pain.  Your vaginal bleeding saturates 2 or more sanitary pads in 1 hour. MAKE SURE YOU:   Understand these instructions.  Will watch your condition.  Will get help right away if you are not doing well or get worse.   This information is not intended to replace advice given to you by your health care provider. Make sure you discuss any questions you have with your health care provider.   Document Released: 04/18/2002 Document Revised: 08/17/2014 Document Reviewed: 03/23/2012 Elsevier Interactive Patient Education [employer].  Breastfeeding Deciding to breastfeed is one of the best choices you can make for you and your baby. A change in hormones during pregnancy causes your breast tissue to grow and increases the number and size of your milk ducts. These hormones also allow proteins, sugars, and fats from your blood supply to make breast milk in your milk-producing glands. Hormones prevent breast milk from being released before your baby is born as well as prompt milk flow after birth. Once breastfeeding has begun, thoughts of your baby, as well as his or her sucking or crying, can stimulate the release of milk from your milk-producing glands.  BENEFITS OF BREASTFEEDING For Your Baby  Your first milk (colostrum) helps your baby's digestive system function better.  There are antibodies in your milk that help your baby fight off infections.  Your baby has a lower incidence of asthma, allergies, and sudden infant death syndrome.  The nutrients in breast milk are better for your baby than infant formulas and are designed uniquely for your baby's needs.  Breast milk improves your baby's brain development.  Your baby is less likely to develop other  conditions, such as childhood obesity, asthma, or type 2 diabetes mellitus. For You  Breastfeeding helps to create a very special bond between you and your baby.  Breastfeeding is convenient. Breast milk is always available at the correct temperature and costs nothing.  Breastfeeding helps to burn calories and helps you lose the weight gained during pregnancy.  Breastfeeding makes your uterus contract to its prepregnancy size faster and slows bleeding (lochia) after you give birth.   Breastfeeding helps to lower your risk of developing type 2 diabetes mellitus, osteoporosis, and breast or ovarian cancer later in life. SIGNS THAT YOUR BABY IS HUNGRY Early Signs of Hunger  Increased alertness or activity.  Stretching.  Movement of the head from side to side.  Movement of the head and opening of the mouth when the corner of the mouth or cheek is stroked (rooting).  Increased sucking sounds, smacking lips, cooing, sighing, or squeaking.  Hand-to-mouth movements.  Increased sucking of fingers or hands. Late Signs of Hunger  Fussing.  Intermittent crying. Extreme Signs of Hunger Signs of extreme hunger will require calming and consoling before your baby will be able to breastfeed successfully. Do not wait for the following signs of extreme hunger to  occur before you initiate breastfeeding:  Restlessness.  A loud, strong cry.  Screaming. BREASTFEEDING BASICS Breastfeeding Initiation  Find a comfortable place to sit or lie down, with your neck and back well supported.  Place a pillow or rolled up blanket under your baby to bring him or her to the level of your breast (if you are seated). Nursing pillows are specially designed to help support your arms and your baby while you breastfeed.  Make sure that your baby's abdomen is facing your abdomen.  Gently massage your breast. With your fingertips, massage from your chest wall toward your nipple in a circular motion. This  encourages milk flow. You may need to continue this action during the feeding if your milk flows slowly.  Support your breast with 4 fingers underneath and your thumb above your nipple. Make sure your fingers are well away from your nipple and your baby's mouth.  Stroke your baby's lips gently with your finger or nipple.  When your baby's mouth is open wide enough, quickly bring your baby to your breast, placing your entire nipple and as much of the colored area around your nipple (areola) as possible into your baby's mouth.  More areola should be visible above your baby's upper lip than below the lower lip.  Your baby's tongue should be between his or her lower gum and your breast.  Ensure that your baby's mouth is correctly positioned around your nipple (latched). Your baby's lips should create a seal on your breast and be turned out (everted).  It is common for your baby to suck about 2-3 minutes in order to start the flow of breast milk. Latching Teaching your baby how to latch on to your breast properly is very important. An improper latch can cause nipple pain and decreased milk supply for you and poor weight gain in your baby. Also, if your baby is not latched onto your nipple properly, he or she may swallow some air during feeding. This can make your baby fussy. Burping your baby when you switch breasts during the feeding can help to get rid of the air. However, teaching your baby to latch on properly is still the best way to prevent fussiness from swallowing air while breastfeeding. Signs that your baby has successfully latched on to your nipple:  Silent tugging or silent sucking, without causing you pain.  Swallowing heard between every 3-4 sucks.  Muscle movement above and in front of his or her ears while sucking. Signs that your baby has not successfully latched on to nipple:  Sucking sounds or smacking sounds from your baby while breastfeeding.  Nipple pain. If you think  your baby has not latched on correctly, slip your finger into the corner of your baby's mouth to break the suction and place it between your baby's gums. Attempt breastfeeding initiation again. Signs of Successful Breastfeeding Signs from your baby:  A gradual decrease in the number of sucks or complete cessation of sucking.  Falling asleep.  Relaxation of his or her body.  Retention of a small amount of milk in his or her mouth.  Letting go of your breast by himself or herself. Signs from you:  Breasts that have increased in firmness, weight, and size 1-3 hours after feeding.  Breasts that are softer immediately after breastfeeding.  Increased milk volume, as well as a change in milk consistency and color by the fifth day of breastfeeding.  Nipples that are not sore, cracked, or bleeding. Signs That Your Baby  is Getting Enough Milk  Wetting at least 3 diapers in a 24-hour period. The urine should be clear and pale yellow by age 38 days.  At least 3 stools in a 24-hour period by age 38 days. The stool should be soft and yellow.  At least 3 stools in a 24-hour period by age 64 days. The stool should be seedy and yellow.  No loss of weight greater than 10% of birth weight during the first 29 days of age.  Average weight gain of 4-7 ounces (113-198 g) per week after age 53 days.  Consistent daily weight gain by age 36 days, without weight loss after the age of 2 weeks. After a feeding, your baby may spit up a small amount. This is common. BREASTFEEDING FREQUENCY AND DURATION Frequent feeding will help you make more milk and can prevent sore nipples and breast engorgement. Breastfeed when you feel the need to reduce the fullness of your breasts or when your baby shows signs of hunger. This is called "breastfeeding on demand." Avoid introducing a pacifier to your baby while you are working to establish breastfeeding (the first 4-6 weeks after your baby is born). After this time you may  choose to use a pacifier. Research has shown that pacifier use during the first year of a baby's life decreases the risk of sudden infant death syndrome (SIDS). Allow your baby to feed on each breast as long as he or she wants. Breastfeed until your baby is finished feeding. When your baby unlatches or falls asleep while feeding from the first breast, offer the second breast. Because newborns are often sleepy in the first few weeks of life, you may need to awaken your baby to get him or her to feed. Breastfeeding times will vary from baby to baby. However, the following rules can serve as a guide to help you ensure that your baby is properly fed:  Newborns (babies 51 weeks of age or younger) may breastfeed every 1-3 hours.  Newborns should not go longer than 3 hours during the day or 5 hours during the night without breastfeeding.  You should breastfeed your baby a minimum of 8 times in a 24-hour period until you begin to introduce solid foods to your baby at around 36 months of age. BREAST MILK PUMPING Pumping and storing breast milk allows you to ensure that your baby is exclusively fed your breast milk, even at times when you are unable to breastfeed. This is especially important if you are going back to work while you are still breastfeeding or when you are not able to be present during feedings. Your lactation consultant can give you guidelines on how long it is safe to store breast milk. A breast pump is a machine that allows you to pump milk from your breast into a sterile bottle. The pumped breast milk can then be stored in a refrigerator or freezer. Some breast pumps are operated by hand, while others use electricity. Ask your lactation consultant which type will work best for you. Breast pumps can be purchased, but some hospitals and breastfeeding support groups lease breast pumps on a monthly basis. A lactation consultant can teach you how to hand express breast milk, if you prefer not to use a  pump. CARING FOR YOUR BREASTS WHILE YOU BREASTFEED Nipples can become dry, cracked, and sore while breastfeeding. The following recommendations can help keep your breasts moisturized and healthy:  Avoid using soap on your nipples.  Wear a supportive bra. Although  not required, special nursing bras and tank tops are designed to allow access to your breasts for breastfeeding without taking off your entire bra or top. Avoid wearing underwire-style bras or extremely tight bras.  Air dry your nipples for 3-31minutes after each feeding.  Use only cotton bra pads to absorb leaked breast milk. Leaking of breast milk between feedings is normal.  Use lanolin on your nipples after breastfeeding. Lanolin helps to maintain your skin's normal moisture barrier. If you use pure lanolin, you do not need to wash it off before feeding your baby again. Pure lanolin is not toxic to your baby. You may also hand express a few drops of breast milk and gently massage that milk into your nipples and allow the milk to air dry. In the first few weeks after giving birth, some women experience extremely full breasts (engorgement). Engorgement can make your breasts feel heavy, warm, and tender to the touch. Engorgement peaks within 3-5 days after you give birth. The following recommendations can help ease engorgement:  Completely empty your breasts while breastfeeding or pumping. You may want to start by applying warm, moist heat (in the shower or with warm water-soaked hand towels) just before feeding or pumping. This increases circulation and helps the milk flow. If your baby does not completely empty your breasts while breastfeeding, pump any extra milk after he or she is finished.  Wear a snug bra (nursing or regular) or tank top for 1-2 days to signal your body to slightly decrease milk production.  Apply ice packs to your breasts, unless this is too uncomfortable for you.  Make sure that your baby is latched on and  positioned properly while breastfeeding. If engorgement persists after 48 hours of following these recommendations, contact your health care provider or a Science writer. OVERALL HEALTH CARE RECOMMENDATIONS WHILE BREASTFEEDING  Eat healthy foods. Alternate between meals and snacks, eating 3 of each per day. Because what you eat affects your breast milk, some of the foods may make your baby more irritable than usual. Avoid eating these foods if you are sure that they are negatively affecting your baby.  Drink milk, fruit juice, and water to satisfy your thirst (about 10 glasses a day).  Rest often, relax, and continue to take your prenatal vitamins to prevent fatigue, stress, and anemia.  Continue breast self-awareness checks.  Avoid chewing and smoking tobacco. Chemicals from cigarettes that pass into breast milk and exposure to secondhand smoke may harm your baby.  Avoid alcohol and drug use, including marijuana. Some medicines that may be harmful to your baby can pass through breast milk. It is important to ask your health care provider before taking any medicine, including all over-the-counter and prescription medicine as well as vitamin and herbal supplements. It is possible to become pregnant while breastfeeding. If birth control is desired, ask your health care provider about options that will be safe for your baby. SEEK MEDICAL CARE IF:  You feel like you want to stop breastfeeding or have become frustrated with breastfeeding.  You have painful breasts or nipples.  Your nipples are cracked or bleeding.  Your breasts are red, tender, or warm.  You have a swollen area on either breast.  You have a fever or chills.  You have nausea or vomiting.  You have drainage other than breast milk from your nipples.  Your breasts do not become full before feedings by the fifth day after you give birth.  You feel sad and depressed.  Your  baby is too sleepy to eat well.  Your  baby is having trouble sleeping.   Your baby is wetting less than 3 diapers in a 24-hour period.  Your baby has less than 3 stools in a 24-hour period.  Your baby's skin or the white part of his or her eyes becomes yellow.   Your baby is not gaining weight by 83 days of age. SEEK IMMEDIATE MEDICAL CARE IF:  Your baby is overly tired (lethargic) and does not want to wake up and feed.  Your baby develops an unexplained fever.   This information is not intended to replace advice given to you by your health care provider. Make sure you discuss any questions you have with your health care provider.   Document Released: 07/27/2005 Document Revised: 04/17/2015 Document Reviewed: 01/18/2013 Elsevier Interactive Patient Education [employer].

## 2016-02-17 ENCOUNTER — Encounter: Payer: Self-pay | Admitting: *Deleted

## 2016-02-28 ENCOUNTER — Ambulatory Visit (INDEPENDENT_AMBULATORY_CARE_PROVIDER_SITE_OTHER): Payer: Medicaid Other | Admitting: Obstetrics

## 2016-02-28 DIAGNOSIS — G44009 Cluster headache syndrome, unspecified, not intractable: Secondary | ICD-10-CM

## 2016-02-28 DIAGNOSIS — G8918 Other acute postprocedural pain: Secondary | ICD-10-CM

## 2016-02-28 DIAGNOSIS — D509 Iron deficiency anemia, unspecified: Secondary | ICD-10-CM

## 2016-02-28 MED ORDER — OXYCODONE HCL 10 MG PO TABS: 10.0000 mg | ORAL_TABLET | Freq: Four times a day (QID) | ORAL | Status: DC | PRN

## 2016-02-28 MED ORDER — IBUPROFEN 800 MG PO TABS: 800.0000 mg | ORAL_TABLET | Freq: Three times a day (TID) | ORAL | Status: DC | PRN

## 2016-02-29 ENCOUNTER — Encounter: Payer: Self-pay | Admitting: Obstetrics

## 2016-02-29 NOTE — Progress Notes (Addendum)
Subjective:     [name] is a 25 y.o. female who presents for a postpartum visit. She is 2 weeks postpartum following a low cervical transverse Cesarean section. I have fully reviewed the prenatal and intrapartum course. The delivery was at 39 gestational weeks. Outcome: repeat cesarean section, low transverse incision. Anesthesia: spinal. Postpartum course has been normal. Baby's course has been normal. Baby is feeding by breast. Bleeding thin lochia. Bowel function is normal. Bladder function is normal. Patient is not sexually active. Contraception method is abstinence. Postpartum depression screening: negative.  Tobacco, alcohol and substance abuse history reviewed.  Adult immunizations reviewed including TDAP, rubella and varicella.  The following portions of the patient's history were reviewed and updated as appropriate: allergies, current medications, past family history, past medical history, past social history, past surgical history and problem list.  Review of Systems A comprehensive review of systems was negative.   Objective:    BP 100/65 mmHg  Pulse 60  Temp(Src) 98 F (36.7 C)  Wt 185 lb (83.915 kg)  General:  alert   Breasts:  inspection negative, no nipple discharge or bleeding, no masses or nodularity palpable  Lungs: clear to auscultation bilaterally  Heart:  regular rate and rhythm, S1, S2 normal, no murmur, click, rub or gallop  Abdomen: normal findings: soft, non-tender and incision clean     Assessment:    2 weeks postpartum.  Doing well.  H/O chronic iron deficiency anemia.  Clinically stable.  Contraceptive counseling and advice  Plan:    1. Iron Rx  2. Contraception: Nexplanon 3. Nexplanon Rx 4. Follow up in: 4 weeks or as needed.   Healthy lifestyle practices reviewed

## 2016-03-26 ENCOUNTER — Ambulatory Visit: Payer: Medicaid Other | Admitting: Obstetrics

## 2016-04-24 DIAGNOSIS — J45909 Unspecified asthma, uncomplicated: Secondary | ICD-10-CM | POA: Diagnosis not present

## 2016-04-24 DIAGNOSIS — R0781 Pleurodynia: Secondary | ICD-10-CM | POA: Diagnosis present

## 2016-04-24 DIAGNOSIS — Z87891 Personal history of nicotine dependence: Secondary | ICD-10-CM | POA: Diagnosis not present

## 2016-04-24 DIAGNOSIS — Z982 Presence of cerebrospinal fluid drainage device: Secondary | ICD-10-CM | POA: Diagnosis not present

## 2016-04-24 DIAGNOSIS — Z91040 Latex allergy status: Secondary | ICD-10-CM | POA: Insufficient documentation

## 2016-04-25 ENCOUNTER — Emergency Department (HOSPITAL_COMMUNITY): Payer: Medicaid Other

## 2016-04-25 ENCOUNTER — Encounter (HOSPITAL_COMMUNITY): Payer: Self-pay | Admitting: Emergency Medicine

## 2016-04-25 ENCOUNTER — Emergency Department (HOSPITAL_COMMUNITY)
Admission: EM | Admit: 2016-04-25 | Discharge: 2016-04-25 | Disposition: A | Discharge status: Home / Self Care | Payer: Medicaid Other | Attending: Emergency Medicine | Admitting: Emergency Medicine

## 2016-04-25 DIAGNOSIS — R52 Pain, unspecified: Secondary | ICD-10-CM

## 2016-04-25 DIAGNOSIS — R0781 Pleurodynia: Secondary | ICD-10-CM

## 2016-04-25 MED ORDER — NAPROXEN 250 MG PO TABS
500.0000 mg | ORAL_TABLET | Freq: Once | ORAL | Status: AC
Start: 2016-04-25 — End: 2016-04-25
  Administered 2016-04-25: 500 mg via ORAL
  Filled 2016-04-25: qty 2

## 2016-04-25 MED ORDER — OXYCODONE-ACETAMINOPHEN 5-325 MG PO TABS
1.0000 | ORAL_TABLET | Freq: Once | ORAL | Status: AC
Start: 2016-04-25 — End: 2016-04-25
  Administered 2016-04-25: 1 via ORAL

## 2016-04-25 MED ORDER — METHOCARBAMOL 500 MG PO TABS: 1000.0000 mg | ORAL_TABLET | Freq: Three times a day (TID) | ORAL | 0 refills | Status: DC | PRN

## 2016-04-25 MED ORDER — METHOCARBAMOL 500 MG PO TABS
1000.0000 mg | ORAL_TABLET | Freq: Once | ORAL | Status: AC
  Administered 2016-04-25: 1000 mg via ORAL
  Filled 2016-04-25: qty 2

## 2016-04-25 MED ORDER — NAPROXEN 500 MG PO TABS: 500.0000 mg | ORAL_TABLET | Freq: Two times a day (BID) | ORAL | 0 refills | Status: DC

## 2016-04-25 MED ORDER — OXYCODONE-ACETAMINOPHEN 5-325 MG PO TABS
ORAL_TABLET | ORAL | Status: DC
Start: 2016-04-25 — End: 2016-04-25
  Filled 2016-04-25: qty 1

## 2016-04-25 NOTE — ED Notes (Signed)
Pt given narcotic pain medicine in triage. Advised of side effects and instructed to avoid driving for a minimum of four hours.  
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
## 2016-04-25 NOTE — Discharge Instructions (Signed)
Medications: Naprosyn, Robaxin  Treatment: Take Naprosyn twice daily as prescribed for 1 week. Take Robaxin three times daily as prescribed. Do not drive or operate machinery when taking this medication. Use most heat on the area.  Follow-up: Please follow up with your regular doctor as needed if your symptoms are not improving. Please return to the emergency department if you develop any new or worsening symptoms, including difficulty breathing.
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
## 2016-04-25 NOTE — ED Notes (Signed)
Pt.stated that she was hurting need something for pain .notified nurse DAVID RN .CHARGE NURSE

## 2016-04-29 ENCOUNTER — Ambulatory Visit: Payer: Self-pay | Admitting: Obstetrics

## 2016-05-04 ENCOUNTER — Ambulatory Visit (INDEPENDENT_AMBULATORY_CARE_PROVIDER_SITE_OTHER): Payer: Medicaid Other | Admitting: Obstetrics

## 2016-05-04 ENCOUNTER — Encounter: Payer: Self-pay | Admitting: *Deleted

## 2016-05-04 ENCOUNTER — Encounter: Payer: Self-pay | Admitting: Obstetrics

## 2016-05-04 VITALS — BP 108/72 | HR 76 | Temp 97.9°F | Wt 180.0 lb

## 2016-05-04 DIAGNOSIS — Z3202 Encounter for pregnancy test, result negative: Secondary | ICD-10-CM | POA: Diagnosis not present

## 2016-05-04 DIAGNOSIS — K589 Irritable bowel syndrome without diarrhea: Secondary | ICD-10-CM

## 2016-05-04 DIAGNOSIS — G43009 Migraine without aura, not intractable, without status migrainosus: Secondary | ICD-10-CM

## 2016-05-04 DIAGNOSIS — Z01818 Encounter for other preprocedural examination: Secondary | ICD-10-CM

## 2016-05-04 DIAGNOSIS — Z3049 Encounter for surveillance of other contraceptives: Secondary | ICD-10-CM | POA: Diagnosis not present

## 2016-05-04 DIAGNOSIS — Z30017 Encounter for initial prescription of implantable subdermal contraceptive: Secondary | ICD-10-CM

## 2016-05-04 LAB — POCT URINE PREGNANCY: Preg Test, Ur: NEGATIVE

## 2016-05-04 MED ORDER — DICYCLOMINE HCL 20 MG PO TABS: 20.0000 mg | ORAL_TABLET | Freq: Three times a day (TID) | ORAL | 5 refills | Status: DC

## 2016-05-04 NOTE — Progress Notes (Signed)
Nexplanon Procedure Note   PRE-OP DIAGNOSIS: desired long-term, reversible contraception  POST-OP DIAGNOSIS: Same  PROCEDURE: Nexplanon  placement Performing Provider: Clenton Pare. Jodi Mourning MD  Patient education prior to procedure, explained risk, benefits of Nexplanon, reviewed alternative options. Patient reported understanding. Gave consent to continue with procedure.   PROCEDURE:  Pregnancy Text :  Negative Site (check):      left arm         Sterile Preparation:   Betadinex3 Lot # [id number] Expiration Date 11 / 2019  Insertion site was selected 8 - 10 cm from medial epicondyle and marked along with guiding site using sterile marker. Procedure area was prepped and draped in a sterile fashion. 1% Lidocaine 1.5 ml given prior to procedure. Nexplanon  was inserted subcutaneously.Needle was removed from the insertion site. Nexplanon capsule was palpated by provider and patient to assure satisfactory placement. Dressing applied.  Followup: The patient tolerated the procedure well without complications.  Standard post-procedure care is explained and return precautions are given.  [name] A. Jodi Mourning MD

## 2016-05-18 ENCOUNTER — Other Ambulatory Visit: Payer: Self-pay | Admitting: Obstetrics

## 2016-05-18 ENCOUNTER — Telehealth: Payer: Self-pay | Admitting: *Deleted

## 2016-05-18 MED ORDER — VITAMIN B-6 100 MG PO TABS: 100.0000 mg | ORAL_TABLET | Freq: Every day | ORAL | 5 refills | Status: DC

## 2016-05-18 MED ORDER — DOXYLAMINE SUCCINATE (SLEEP) 25 MG PO TABS: 25.0000 mg | ORAL_TABLET | Freq: Every day | ORAL | 5 refills | Status: DC

## 2016-05-18 NOTE — Telephone Encounter (Signed)
Call pharmacy and cancel prescriptions for pyridoxine ( B6 ) and doxylamine succinate that were e-scribed.

## 2016-05-18 NOTE — Telephone Encounter (Signed)
Fax from pharmacy stating that Dicyclomine is currently on backorder. Could you please change Rx?   Please review and send new Rx.

## 2016-05-22 ENCOUNTER — Ambulatory Visit: Payer: Medicaid Other | Admitting: Obstetrics

## 2016-06-02 ENCOUNTER — Ambulatory Visit: Payer: Medicaid Other | Admitting: Neurology

## 2016-06-02 ENCOUNTER — Telehealth: Payer: Self-pay | Admitting: *Deleted

## 2016-06-02 NOTE — Telephone Encounter (Signed)
No showed new patient appointment. 

## 2016-12-02 ENCOUNTER — Emergency Department (HOSPITAL_COMMUNITY)
Admission: EM | Admit: 2016-12-02 | Discharge: 2016-12-02 | Disposition: A | Discharge status: Home / Self Care | Payer: Medicaid Other | Attending: Emergency Medicine | Admitting: Emergency Medicine

## 2016-12-02 ENCOUNTER — Encounter (HOSPITAL_COMMUNITY): Payer: Self-pay

## 2016-12-02 DIAGNOSIS — Z91040 Latex allergy status: Secondary | ICD-10-CM | POA: Insufficient documentation

## 2016-12-02 DIAGNOSIS — K0889 Other specified disorders of teeth and supporting structures: Secondary | ICD-10-CM | POA: Insufficient documentation

## 2016-12-02 DIAGNOSIS — Z79899 Other long term (current) drug therapy: Secondary | ICD-10-CM | POA: Insufficient documentation

## 2016-12-02 DIAGNOSIS — Z87891 Personal history of nicotine dependence: Secondary | ICD-10-CM | POA: Insufficient documentation

## 2016-12-02 DIAGNOSIS — J45909 Unspecified asthma, uncomplicated: Secondary | ICD-10-CM | POA: Insufficient documentation

## 2016-12-02 MED ORDER — IBUPROFEN 400 MG PO TABS
400.0000 mg | ORAL_TABLET | Freq: Once | ORAL | Status: AC
  Administered 2016-12-02: 400 mg via ORAL
  Filled 2016-12-02: qty 1

## 2016-12-02 MED ORDER — OXYCODONE-ACETAMINOPHEN 5-325 MG PO TABS
1.0000 | ORAL_TABLET | Freq: Once | ORAL | Status: AC
  Administered 2016-12-02: 1 via ORAL
  Filled 2016-12-02: qty 1
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
## 2016-12-02 NOTE — ED Triage Notes (Addendum)
LEft side headache that started today at 1600. PT states she has a dry socket in her left upper molar that has been causing her pain. Pt states the pain radiates into her ear.  PT is tearful and rocking back and forward in triage. Pt takes she took tylenol around 4 when pain started with no relief

## 2016-12-03 NOTE — ED Provider Notes (Signed)
I saw and evaluated the patient, reviewed the resident's note and I agree with the findings and plan.   EKG Interpretation None      26 year old female who presents with dental pain. States she was supposed to have her molars removed several months ago, but never was able to follow-up with dentist due to financial reasons. States yesterday developed left maxillary posterior dental pain that radiates up to the left temple. No facial swelling, fever or chills.   With no facial swelling or gingival swelling. Left maxillary molars are coming in, and premolar appears to have crack/fracture associated with it causing pain. No signs of infection.  Low cost dental resources provided. Ibuprofen and tylenol for pain.   Forde Dandy, MD 12/03/16 [phone]

## 2016-12-21 ENCOUNTER — Encounter (HOSPITAL_COMMUNITY): Payer: Self-pay | Admitting: Emergency Medicine

## 2016-12-21 ENCOUNTER — Emergency Department (HOSPITAL_COMMUNITY)
Admission: EM | Admit: 2016-12-21 | Discharge: 2016-12-21 | Disposition: A | Discharge status: Home / Self Care | Payer: Medicaid Other | Attending: Dermatology | Admitting: Dermatology

## 2016-12-21 ENCOUNTER — Emergency Department (HOSPITAL_COMMUNITY)
Admission: EM | Admit: 2016-12-21 | Discharge: 2016-12-21 | Disposition: A | Discharge status: Home / Self Care | Payer: Medicaid Other | Attending: Emergency Medicine | Admitting: Emergency Medicine

## 2016-12-21 DIAGNOSIS — G43909 Migraine, unspecified, not intractable, without status migrainosus: Secondary | ICD-10-CM | POA: Insufficient documentation

## 2016-12-21 DIAGNOSIS — Z87891 Personal history of nicotine dependence: Secondary | ICD-10-CM | POA: Insufficient documentation

## 2016-12-21 DIAGNOSIS — Z79899 Other long term (current) drug therapy: Secondary | ICD-10-CM | POA: Insufficient documentation

## 2016-12-21 DIAGNOSIS — R112 Nausea with vomiting, unspecified: Secondary | ICD-10-CM | POA: Insufficient documentation

## 2016-12-21 DIAGNOSIS — Z5321 Procedure and treatment not carried out due to patient leaving prior to being seen by health care provider: Secondary | ICD-10-CM | POA: Insufficient documentation

## 2016-12-21 DIAGNOSIS — Z91040 Latex allergy status: Secondary | ICD-10-CM | POA: Insufficient documentation

## 2016-12-21 DIAGNOSIS — J45909 Unspecified asthma, uncomplicated: Secondary | ICD-10-CM | POA: Insufficient documentation

## 2016-12-21 DIAGNOSIS — G43009 Migraine without aura, not intractable, without status migrainosus: Secondary | ICD-10-CM | POA: Insufficient documentation

## 2016-12-21 MED ORDER — DIPHENHYDRAMINE HCL 50 MG/ML IJ SOLN
25.0000 mg | Freq: Once | INTRAMUSCULAR | Status: AC
  Administered 2016-12-21: 25 mg via INTRAVENOUS
  Filled 2016-12-21: qty 1

## 2016-12-21 MED ORDER — DEXAMETHASONE SODIUM PHOSPHATE 10 MG/ML IJ SOLN
10.0000 mg | Freq: Once | INTRAMUSCULAR | Status: AC
  Administered 2016-12-21: 10 mg via INTRAVENOUS
  Filled 2016-12-21: qty 1

## 2016-12-21 MED ORDER — SODIUM CHLORIDE 0.9 % IV BOLUS (SEPSIS)
1000.0000 mL | Freq: Once | INTRAVENOUS | Status: AC
  Administered 2016-12-21: 1000 mL via INTRAVENOUS

## 2016-12-21 MED ORDER — PROCHLORPERAZINE EDISYLATE 5 MG/ML IJ SOLN
10.0000 mg | Freq: Once | INTRAMUSCULAR | Status: AC
  Administered 2016-12-21: 10 mg via INTRAVENOUS
  Filled 2016-12-21: qty 2

## 2016-12-21 NOTE — ED Notes (Signed)
No answer in waiting area.  Blanket and labels seen in floor of waiting room.

## 2016-12-21 NOTE — ED Notes (Signed)
No answer in waiting area.

## 2016-12-21 NOTE — ED Triage Notes (Signed)
Pt c/o generalized headache right side worse than left onset 1 hour PTA. Pt has not taken anything for pain. Pt has light sensitivity. Ot denies N/V.

## 2016-12-21 NOTE — ED Triage Notes (Signed)
Patient states that she was seen earlier this morning at [facility] for migraine and they were taking so long she went home and took tylenol PM and when she woke up migraine was back. Light sensitivity. Patient on right side and radiating to right ear.
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
## 2016-12-21 NOTE — ED Notes (Signed)
Unable to locate pt  

## 2017-03-25 ENCOUNTER — Encounter (HOSPITAL_COMMUNITY): Payer: Self-pay | Admitting: Emergency Medicine

## 2017-03-25 ENCOUNTER — Emergency Department (HOSPITAL_COMMUNITY)
Admission: EM | Admit: 2017-03-25 | Discharge: 2017-03-25 | Disposition: A | Discharge status: Home / Self Care | Payer: Medicaid Other | Attending: Emergency Medicine | Admitting: Emergency Medicine

## 2017-03-25 DIAGNOSIS — Z87891 Personal history of nicotine dependence: Secondary | ICD-10-CM | POA: Insufficient documentation

## 2017-03-25 DIAGNOSIS — J45909 Unspecified asthma, uncomplicated: Secondary | ICD-10-CM | POA: Insufficient documentation

## 2017-03-25 DIAGNOSIS — Z79899 Other long term (current) drug therapy: Secondary | ICD-10-CM | POA: Insufficient documentation

## 2017-03-25 DIAGNOSIS — Z91040 Latex allergy status: Secondary | ICD-10-CM | POA: Insufficient documentation

## 2017-03-25 DIAGNOSIS — F331 Major depressive disorder, recurrent, moderate: Secondary | ICD-10-CM | POA: Insufficient documentation

## 2017-03-25 DIAGNOSIS — F410 Panic disorder [episodic paroxysmal anxiety] without agoraphobia: Secondary | ICD-10-CM | POA: Insufficient documentation

## 2017-03-25 MED ORDER — HYDROXYZINE HCL 25 MG PO TABS: 25.0000 mg | ORAL_TABLET | Freq: Four times a day (QID) | ORAL | 0 refills | Status: DC | PRN

## 2017-03-25 NOTE — ED Notes (Signed)
Bed: [facility]PT4 Expected date:  Expected time:  Means of arrival:  Comments: 

## 2017-03-25 NOTE — ED Triage Notes (Signed)
Patient here from home with complaints of anxiety. Reports frequent anxiety attacks. States that she cannot control them. "I do not know what brings them on" .Hx of same.
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
## 2017-05-18 ENCOUNTER — Encounter (HOSPITAL_COMMUNITY): Payer: Self-pay

## 2017-05-18 ENCOUNTER — Emergency Department (HOSPITAL_COMMUNITY): Payer: Self-pay

## 2017-05-18 ENCOUNTER — Emergency Department (HOSPITAL_COMMUNITY)
Admission: EM | Admit: 2017-05-18 | Discharge: 2017-05-18 | Disposition: A | Discharge status: Home / Self Care | Payer: Self-pay | Attending: Emergency Medicine | Admitting: Emergency Medicine

## 2017-05-18 DIAGNOSIS — J45909 Unspecified asthma, uncomplicated: Secondary | ICD-10-CM | POA: Insufficient documentation

## 2017-05-18 DIAGNOSIS — Z79899 Other long term (current) drug therapy: Secondary | ICD-10-CM | POA: Insufficient documentation

## 2017-05-18 DIAGNOSIS — R1032 Left lower quadrant pain: Secondary | ICD-10-CM | POA: Insufficient documentation

## 2017-05-18 DIAGNOSIS — J069 Acute upper respiratory infection, unspecified: Secondary | ICD-10-CM | POA: Insufficient documentation

## 2017-05-18 DIAGNOSIS — B349 Viral infection, unspecified: Secondary | ICD-10-CM | POA: Insufficient documentation

## 2017-05-18 DIAGNOSIS — Z91040 Latex allergy status: Secondary | ICD-10-CM | POA: Insufficient documentation

## 2017-05-18 LAB — COMPREHENSIVE METABOLIC PANEL
ALT: 12 U/L — AB (ref 14–54)
ANION GAP: 9 (ref 5–15)
AST: 17 U/L (ref 15–41)
Albumin: 3.9 g/dL (ref 3.5–5.0)
Alkaline Phosphatase: 46 U/L (ref 38–126)
BUN: 9 mg/dL (ref 6–20)
CO2: 21 mmol/L — AB (ref 22–32)
CREATININE: 0.73 mg/dL (ref 0.44–1.00)
Calcium: 8.8 mg/dL — ABNORMAL LOW (ref 8.9–10.3)
Chloride: 106 mmol/L (ref 101–111)
Glucose, Bld: 90 mg/dL (ref 65–99)
Potassium: 3.6 mmol/L (ref 3.5–5.1)
SODIUM: 136 mmol/L (ref 135–145)
Total Bilirubin: 0.5 mg/dL (ref 0.3–1.2)
Total Protein: 7.2 g/dL (ref 6.5–8.1)

## 2017-05-18 LAB — CBC
HEMATOCRIT: 37.2 % (ref 36.0–46.0)
HEMOGLOBIN: 12.2 g/dL (ref 12.0–15.0)
MCH: 30 pg (ref 26.0–34.0)
MCHC: 32.8 g/dL (ref 30.0–36.0)
MCV: 91.6 fL (ref 78.0–100.0)
Platelets: 236 10*3/uL (ref 150–400)
RBC: 4.06 MIL/uL (ref 3.87–5.11)
RDW: 13.2 % (ref 11.5–15.5)
WBC: 6.5 10*3/uL (ref 4.0–10.5)

## 2017-05-18 LAB — URINALYSIS, ROUTINE W REFLEX MICROSCOPIC
Bilirubin Urine: NEGATIVE
GLUCOSE, UA: NEGATIVE mg/dL
HGB URINE DIPSTICK: NEGATIVE
Ketones, ur: NEGATIVE mg/dL
LEUKOCYTES UA: NEGATIVE
Nitrite: NEGATIVE
PH: 6 (ref 5.0–8.0)
PROTEIN: NEGATIVE mg/dL
Specific Gravity, Urine: 1.021 (ref 1.005–1.030)

## 2017-05-18 LAB — LIPASE, BLOOD: LIPASE: 22 U/L (ref 11–51)

## 2017-05-18 LAB — RAPID STREP SCREEN (MED CTR MEBANE ONLY): Streptococcus, Group A Screen (Direct): NEGATIVE

## 2017-05-18 LAB — POC URINE PREG, ED: Preg Test, Ur: NEGATIVE

## 2017-05-18 MED ORDER — IOPAMIDOL (ISOVUE-300) INJECTION 61%
INTRAVENOUS | Status: AC
  Administered 2017-05-18: 100 mL
  Filled 2017-05-18: qty 100

## 2017-05-18 NOTE — ED Notes (Signed)
Patient transported to CT 
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
## 2017-05-18 NOTE — ED Triage Notes (Signed)
Pt states that for the past two days has been having cough and sore throat along with abd pain nausea and diarrhea.

## 2017-05-20 LAB — CULTURE, GROUP A STREP (THRC)

## 2017-07-13 ENCOUNTER — Emergency Department (HOSPITAL_COMMUNITY): Admission: EM | Admit: 2017-07-13 | Discharge: 2017-07-13 | Discharge status: Against Medical Advice | Payer: Self-pay

## 2017-07-13 NOTE — ED Notes (Signed)
Called for triage X3 no answer

## 2017-07-13 NOTE — ED Notes (Signed)
Called x 2 NO answer 

## 2017-07-15 ENCOUNTER — Encounter: Payer: Self-pay | Admitting: Emergency Medicine

## 2017-07-15 ENCOUNTER — Other Ambulatory Visit: Payer: Self-pay

## 2017-07-15 ENCOUNTER — Emergency Department
Admission: EM | Admit: 2017-07-15 | Discharge: 2017-07-15 | Disposition: A | Discharge status: Home / Self Care | Payer: Self-pay | Attending: Emergency Medicine | Admitting: Emergency Medicine

## 2017-07-15 DIAGNOSIS — M069 Rheumatoid arthritis, unspecified: Secondary | ICD-10-CM | POA: Insufficient documentation

## 2017-07-15 DIAGNOSIS — J45909 Unspecified asthma, uncomplicated: Secondary | ICD-10-CM | POA: Insufficient documentation

## 2017-07-15 DIAGNOSIS — R51 Headache: Secondary | ICD-10-CM | POA: Insufficient documentation

## 2017-07-15 DIAGNOSIS — Z87891 Personal history of nicotine dependence: Secondary | ICD-10-CM | POA: Insufficient documentation

## 2017-07-15 DIAGNOSIS — R519 Headache, unspecified: Secondary | ICD-10-CM

## 2017-07-15 DIAGNOSIS — Z91040 Latex allergy status: Secondary | ICD-10-CM | POA: Insufficient documentation

## 2017-07-15 DIAGNOSIS — Z79899 Other long term (current) drug therapy: Secondary | ICD-10-CM | POA: Insufficient documentation

## 2017-07-15 MED ORDER — SODIUM CHLORIDE 0.9 % IV BOLUS (SEPSIS)
1000.0000 mL | Freq: Once | INTRAVENOUS | Status: AC
  Administered 2017-07-15: 1000 mL via INTRAVENOUS

## 2017-07-15 MED ORDER — PROCHLORPERAZINE EDISYLATE 5 MG/ML IJ SOLN
10.0000 mg | Freq: Once | INTRAMUSCULAR | Status: AC
  Administered 2017-07-15: 10 mg via INTRAVENOUS
  Filled 2017-07-15: qty 2

## 2017-07-15 MED ORDER — DIPHENHYDRAMINE HCL 50 MG/ML IJ SOLN
25.0000 mg | Freq: Once | INTRAMUSCULAR | Status: AC
  Administered 2017-07-15: 25 mg via INTRAVENOUS
  Filled 2017-07-15: qty 1

## 2017-07-15 MED ORDER — DEXAMETHASONE SODIUM PHOSPHATE 10 MG/ML IJ SOLN
10.0000 mg | Freq: Once | INTRAMUSCULAR | Status: AC
  Administered 2017-07-15: 10 mg via INTRAVENOUS
  Filled 2017-07-15: qty 1

## 2017-07-15 NOTE — ED Triage Notes (Signed)
Migraine HA x 8 days, was seen at [facility] 2 days ago, Hx of migraine.  Pt c/o photosensitivity and nausea.  Pt does not have insurance and has not been seen by neuro.  Pt states she has tried multiple OTC meds without relief.   Pt states the pain is in her temples. Pt unable to sit still in triage.  Pt states she usually goes to [facility] and is given IVF.

## 2017-07-15 NOTE — ED Notes (Signed)
PT left without discharge paperwork or signing out

## 2017-07-15 NOTE — ED Notes (Addendum)
Pt left with IV in arm, garbage checked with no sign of catheter, pt cell phone number that was provided in demographics was called with no answer. [employer] aware at this time of pt exsisting IV access
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
## 2017-07-15 NOTE — Discharge Instructions (Signed)
You were evaluated for headache, and although no certain cause found, I am most suspicious of migraine type headache.  For formal diagnosis, you do need to be seen by a neurology specialist.  I have given the office number for Dr. Melrose Nakayama.  Please also follow-up with primary care doctor.  Return to the emergency department immediately for any new or worsening condition including fever, neck stiffness, neck pain, new or worsening or uncontrolled headache, vision changes such as blurry vision double vision or loss of vision, facial droop, weakness of arms or legs, numbness, dizziness or passing out, altered mental status, or any other symptoms concerning to you.

## 2017-07-15 NOTE — ED Notes (Signed)
Upon entering room for reassessment with MD, room is empty pt and belongings are not seen.

## 2017-07-15 NOTE — ED Notes (Signed)
Pt c/o headache, hx of migraines. Sensitivity to light, pt tearful, unable to sit still. Speech clear

## 2017-11-02 ENCOUNTER — Ambulatory Visit (INDEPENDENT_AMBULATORY_CARE_PROVIDER_SITE_OTHER): Payer: Self-pay | Admitting: Physician Assistant

## 2018-01-16 ENCOUNTER — Emergency Department (HOSPITAL_COMMUNITY)
Admission: EM | Admit: 2018-01-16 | Discharge: 2018-01-16 | Disposition: A | Discharge status: Home / Self Care | Payer: Self-pay | Attending: Emergency Medicine | Admitting: Emergency Medicine

## 2018-01-16 ENCOUNTER — Encounter (HOSPITAL_COMMUNITY): Payer: Self-pay | Admitting: Emergency Medicine

## 2018-01-16 DIAGNOSIS — Z79899 Other long term (current) drug therapy: Secondary | ICD-10-CM | POA: Insufficient documentation

## 2018-01-16 DIAGNOSIS — Z87891 Personal history of nicotine dependence: Secondary | ICD-10-CM | POA: Insufficient documentation

## 2018-01-16 DIAGNOSIS — J45909 Unspecified asthma, uncomplicated: Secondary | ICD-10-CM | POA: Insufficient documentation

## 2018-01-16 DIAGNOSIS — Z91040 Latex allergy status: Secondary | ICD-10-CM | POA: Insufficient documentation

## 2018-01-16 DIAGNOSIS — K047 Periapical abscess without sinus: Secondary | ICD-10-CM | POA: Insufficient documentation

## 2018-01-16 MED ORDER — OXYCODONE-ACETAMINOPHEN 5-325 MG PO TABS
1.0000 | ORAL_TABLET | Freq: Once | ORAL | Status: AC
  Administered 2018-01-16: 1 via ORAL
  Filled 2018-01-16: qty 1

## 2018-01-16 MED ORDER — NAPROXEN 500 MG PO TABS: 500.0000 mg | ORAL_TABLET | Freq: Two times a day (BID) | ORAL | 0 refills | Status: DC

## 2018-01-16 MED ORDER — CLINDAMYCIN HCL 150 MG PO CAPS
300.0000 mg | ORAL_CAPSULE | Freq: Once | ORAL | Status: AC
  Administered 2018-01-16: 300 mg via ORAL
  Filled 2018-01-16: qty 2

## 2018-01-16 MED ORDER — CLINDAMYCIN HCL 300 MG PO CAPS: 300.0000 mg | ORAL_CAPSULE | Freq: Three times a day (TID) | ORAL | 0 refills | Status: DC

## 2018-01-16 NOTE — ED Triage Notes (Signed)
Pt presents to ED for assessment for right facial swelling, pain to her cheek and around her eye, states hx of dental abscess to same area.  States sudden onset this morning.  Patient in tears in triage.  Pt denies difficulty swallowing or breathing.

## 2018-01-16 NOTE — ED Notes (Signed)
ED Provider at bedside. 

## 2018-01-16 NOTE — ED Notes (Signed)
Patient states that she has a ride home after percocet administration.

## 2018-01-16 NOTE — Discharge Instructions (Addendum)
Follow-up with a dentist as soon as possible.
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
## 2018-01-18 ENCOUNTER — Emergency Department (HOSPITAL_COMMUNITY): Payer: Self-pay

## 2018-01-18 ENCOUNTER — Encounter (HOSPITAL_COMMUNITY): Payer: Self-pay

## 2018-01-18 ENCOUNTER — Other Ambulatory Visit: Payer: Self-pay

## 2018-01-18 ENCOUNTER — Emergency Department (HOSPITAL_COMMUNITY)
Admission: EM | Admit: 2018-01-18 | Discharge: 2018-01-18 | Disposition: A | Discharge status: Home / Self Care | Payer: Self-pay | Attending: Emergency Medicine | Admitting: Emergency Medicine

## 2018-01-18 DIAGNOSIS — Z87891 Personal history of nicotine dependence: Secondary | ICD-10-CM | POA: Insufficient documentation

## 2018-01-18 DIAGNOSIS — K047 Periapical abscess without sinus: Secondary | ICD-10-CM | POA: Insufficient documentation

## 2018-01-18 DIAGNOSIS — Z91040 Latex allergy status: Secondary | ICD-10-CM | POA: Insufficient documentation

## 2018-01-18 DIAGNOSIS — Z79899 Other long term (current) drug therapy: Secondary | ICD-10-CM | POA: Insufficient documentation

## 2018-01-18 DIAGNOSIS — J45909 Unspecified asthma, uncomplicated: Secondary | ICD-10-CM | POA: Insufficient documentation

## 2018-01-18 MED ORDER — OXYCODONE-ACETAMINOPHEN 5-325 MG PO TABS
1.0000 | ORAL_TABLET | Freq: Once | ORAL | Status: AC
  Administered 2018-01-18: 1 via ORAL
  Filled 2018-01-18: qty 1

## 2018-01-18 MED ORDER — IOHEXOL 300 MG/ML  SOLN
75.0000 mL | Freq: Once | INTRAMUSCULAR | Status: AC | PRN
  Administered 2018-01-18: 75 mL via INTRAVENOUS

## 2018-01-18 MED ORDER — IOPAMIDOL (ISOVUE-300) INJECTION 61%: 75.0000 mL | Freq: Once | INTRAVENOUS | Status: DC | PRN
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
## 2018-01-18 NOTE — ED Triage Notes (Signed)
Pt states she was at [facility] 3 days ago for same complaint. Right sided dental swelling. Pt states she has been taking abx and naproxen without relief. Pt states that the dentist will not treat her until the infection is gone.

## 2018-01-18 NOTE — Discharge Instructions (Addendum)
Go to Dr. Lupita Leash office now.

## 2018-01-18 NOTE — ED Provider Notes (Signed)
Medical screening examination/treatment/procedure(s) were conducted as a shared visit with non-physician practitioner(s) and myself.  I personally evaluated the patient during the encounter.  Patient on clindamycin for right dental infection with progressively worsening facial swelling.  On my exam airways intact.  No evidence of sepsis.  She states that she talk to the dentist and they would not do anything about it.  Plan will be to discuss with oral surgery and likely admission for IV antibiotics.    Merrily Pew, MD 01/19/18 [phone]

## 2018-11-07 ENCOUNTER — Other Ambulatory Visit: Payer: Self-pay

## 2018-11-07 ENCOUNTER — Emergency Department (HOSPITAL_COMMUNITY)
Admission: EM | Admit: 2018-11-07 | Discharge: 2018-11-07 | Disposition: A | Discharge status: Home / Self Care | Payer: Self-pay | Attending: Emergency Medicine | Admitting: Emergency Medicine

## 2018-11-07 ENCOUNTER — Encounter (HOSPITAL_COMMUNITY): Payer: Self-pay | Admitting: Student

## 2018-11-07 DIAGNOSIS — Z87891 Personal history of nicotine dependence: Secondary | ICD-10-CM | POA: Insufficient documentation

## 2018-11-07 DIAGNOSIS — Z91040 Latex allergy status: Secondary | ICD-10-CM | POA: Insufficient documentation

## 2018-11-07 DIAGNOSIS — R51 Headache: Secondary | ICD-10-CM | POA: Insufficient documentation

## 2018-11-07 DIAGNOSIS — R519 Headache, unspecified: Secondary | ICD-10-CM

## 2018-11-07 DIAGNOSIS — J45909 Unspecified asthma, uncomplicated: Secondary | ICD-10-CM | POA: Insufficient documentation

## 2018-11-07 DIAGNOSIS — Z79899 Other long term (current) drug therapy: Secondary | ICD-10-CM | POA: Insufficient documentation

## 2018-11-07 LAB — I-STAT BETA HCG BLOOD, ED (MC, WL, AP ONLY)

## 2018-11-07 MED ORDER — SODIUM CHLORIDE 0.9 % IV BOLUS
500.0000 mL | Freq: Once | INTRAVENOUS | Status: AC
  Administered 2018-11-07: 500 mL via INTRAVENOUS

## 2018-11-07 MED ORDER — MAGNESIUM SULFATE 2 GM/50ML IV SOLN
2.0000 g | Freq: Once | INTRAVENOUS | Status: DC
  Filled 2018-11-07: qty 50

## 2018-11-07 MED ORDER — DIPHENHYDRAMINE HCL 50 MG/ML IJ SOLN
12.5000 mg | Freq: Once | INTRAMUSCULAR | Status: AC
  Administered 2018-11-07: 12.5 mg via INTRAVENOUS
  Filled 2018-11-07: qty 1

## 2018-11-07 MED ORDER — PROCHLORPERAZINE EDISYLATE 10 MG/2ML IJ SOLN
10.0000 mg | Freq: Once | INTRAMUSCULAR | Status: AC
  Administered 2018-11-07: 10 mg via INTRAVENOUS
  Filled 2018-11-07: qty 2

## 2018-11-07 MED ORDER — KETOROLAC TROMETHAMINE 15 MG/ML IJ SOLN
15.0000 mg | Freq: Once | INTRAMUSCULAR | Status: DC
  Filled 2018-11-07: qty 1

## 2018-11-07 MED ORDER — DEXAMETHASONE SODIUM PHOSPHATE 10 MG/ML IJ SOLN
10.0000 mg | Freq: Once | INTRAMUSCULAR | Status: AC
  Administered 2018-11-07: 10 mg via INTRAVENOUS
  Filled 2018-11-07: qty 1
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
## 2018-11-07 NOTE — ED Notes (Signed)
Patient verbalizes understanding of discharge instructions. Opportunity for questioning and answering were provided.  patient discharged from ED.  

## 2018-11-07 NOTE — ED Triage Notes (Signed)
Per pt she has been having migraine for 3 weeks taking otc for 3 weeks with no relief. HX OF MIGRAINES

## 2018-11-07 NOTE — Discharge Instructions (Addendum)
You were seen in the ER today for a headache.  Your headache improved with a migraine cocktail.  Please follow up with primary care within 3 days for re-evaluation and to possibly discuss preventative medicines.  Return to the ER for new or worsening symptoms including but not limited to worsened pain, change in pain quality, change in vision, numbness, weakness, seizure activity or any other concerns.

## 2018-11-09 ENCOUNTER — Other Ambulatory Visit: Payer: Self-pay

## 2018-11-09 ENCOUNTER — Encounter (HOSPITAL_COMMUNITY): Payer: Self-pay

## 2018-11-09 ENCOUNTER — Emergency Department (HOSPITAL_COMMUNITY)
Admission: EM | Admit: 2018-11-09 | Discharge: 2018-11-09 | Disposition: A | Discharge status: Home / Self Care | Payer: Self-pay | Attending: Emergency Medicine | Admitting: Emergency Medicine

## 2018-11-09 DIAGNOSIS — Z7982 Long term (current) use of aspirin: Secondary | ICD-10-CM | POA: Insufficient documentation

## 2018-11-09 DIAGNOSIS — Z87891 Personal history of nicotine dependence: Secondary | ICD-10-CM | POA: Insufficient documentation

## 2018-11-09 DIAGNOSIS — Z79899 Other long term (current) drug therapy: Secondary | ICD-10-CM | POA: Insufficient documentation

## 2018-11-09 DIAGNOSIS — J45909 Unspecified asthma, uncomplicated: Secondary | ICD-10-CM | POA: Insufficient documentation

## 2018-11-09 DIAGNOSIS — R51 Headache: Secondary | ICD-10-CM | POA: Insufficient documentation

## 2018-11-09 DIAGNOSIS — R519 Headache, unspecified: Secondary | ICD-10-CM

## 2018-11-09 MED ORDER — DIPHENHYDRAMINE HCL 50 MG/ML IJ SOLN
25.0000 mg | Freq: Once | INTRAMUSCULAR | Status: AC
  Administered 2018-11-09: 25 mg via INTRAVENOUS
  Filled 2018-11-09: qty 1

## 2018-11-09 MED ORDER — SODIUM CHLORIDE 0.9 % IV BOLUS
1000.0000 mL | Freq: Once | INTRAVENOUS | Status: AC
  Administered 2018-11-09: 1000 mL via INTRAVENOUS

## 2018-11-09 MED ORDER — KETOROLAC TROMETHAMINE 30 MG/ML IJ SOLN
30.0000 mg | Freq: Once | INTRAMUSCULAR | Status: AC
  Administered 2018-11-09: 02:00:00 30 mg via INTRAVENOUS
  Filled 2018-11-09: qty 1

## 2018-11-09 MED ORDER — METOCLOPRAMIDE HCL 5 MG/ML IJ SOLN
10.0000 mg | Freq: Once | INTRAMUSCULAR | Status: AC
  Administered 2018-11-09: 10 mg via INTRAVENOUS
  Filled 2018-11-09: qty 2

## 2018-11-09 NOTE — ED Notes (Signed)
Pt discharged from ED; instructions provided; Pt encouraged to return to ED if symptoms worsen and to f/u with PCP; Pt verbalized understanding of all instructions 
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
## 2018-11-09 NOTE — ED Triage Notes (Signed)
Pt here for a migraine for the last 3 weeks.  [name] here on the 30th of March for  The same.  No improvement with pain.  A&Ox4 no neuro symptoms.  No N/V.

## 2018-11-16 ENCOUNTER — Other Ambulatory Visit: Payer: Self-pay

## 2018-11-16 ENCOUNTER — Emergency Department (HOSPITAL_COMMUNITY)
Admission: EM | Admit: 2018-11-16 | Discharge: 2018-11-17 | Disposition: A | Discharge status: Home / Self Care | Payer: Self-pay | Attending: Emergency Medicine | Admitting: Emergency Medicine

## 2018-11-16 ENCOUNTER — Encounter (HOSPITAL_COMMUNITY): Payer: Self-pay | Admitting: Emergency Medicine

## 2018-11-16 DIAGNOSIS — F419 Anxiety disorder, unspecified: Secondary | ICD-10-CM | POA: Insufficient documentation

## 2018-11-16 DIAGNOSIS — Z87891 Personal history of nicotine dependence: Secondary | ICD-10-CM | POA: Insufficient documentation

## 2018-11-16 DIAGNOSIS — J45909 Unspecified asthma, uncomplicated: Secondary | ICD-10-CM | POA: Insufficient documentation

## 2018-11-16 DIAGNOSIS — Z9049 Acquired absence of other specified parts of digestive tract: Secondary | ICD-10-CM | POA: Insufficient documentation

## 2018-11-16 DIAGNOSIS — F329 Major depressive disorder, single episode, unspecified: Secondary | ICD-10-CM | POA: Insufficient documentation

## 2018-11-16 DIAGNOSIS — Z79899 Other long term (current) drug therapy: Secondary | ICD-10-CM | POA: Insufficient documentation

## 2018-11-16 DIAGNOSIS — Z91040 Latex allergy status: Secondary | ICD-10-CM | POA: Insufficient documentation

## 2018-11-16 DIAGNOSIS — G44209 Tension-type headache, unspecified, not intractable: Secondary | ICD-10-CM | POA: Insufficient documentation

## 2018-11-16 MED ORDER — CYCLOBENZAPRINE HCL 10 MG PO TABS: 5.0000 mg | ORAL_TABLET | Freq: Every day | ORAL | 0 refills | Status: AC

## 2018-11-16 NOTE — ED Triage Notes (Signed)
Co R sided migraine since Monday morning with severe R eye pain.  Hx of same.  Mild nausea.
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
## 2018-11-30 ENCOUNTER — Other Ambulatory Visit: Payer: Self-pay

## 2018-11-30 ENCOUNTER — Encounter (HOSPITAL_COMMUNITY): Payer: Self-pay | Admitting: Emergency Medicine

## 2018-11-30 ENCOUNTER — Emergency Department (HOSPITAL_COMMUNITY)
Admission: EM | Admit: 2018-11-30 | Discharge: 2018-12-01 | Disposition: A | Discharge status: Home / Self Care | Payer: Self-pay | Attending: Emergency Medicine | Admitting: Emergency Medicine

## 2018-11-30 DIAGNOSIS — Z87891 Personal history of nicotine dependence: Secondary | ICD-10-CM | POA: Insufficient documentation

## 2018-11-30 DIAGNOSIS — R1032 Left lower quadrant pain: Secondary | ICD-10-CM | POA: Insufficient documentation

## 2018-11-30 DIAGNOSIS — M3210 Systemic lupus erythematosus, organ or system involvement unspecified: Secondary | ICD-10-CM | POA: Insufficient documentation

## 2018-11-30 DIAGNOSIS — R51 Headache: Secondary | ICD-10-CM | POA: Insufficient documentation

## 2018-11-30 DIAGNOSIS — R0602 Shortness of breath: Secondary | ICD-10-CM | POA: Insufficient documentation

## 2018-11-30 DIAGNOSIS — M069 Rheumatoid arthritis, unspecified: Secondary | ICD-10-CM | POA: Insufficient documentation

## 2018-11-30 DIAGNOSIS — R350 Frequency of micturition: Secondary | ICD-10-CM | POA: Insufficient documentation

## 2018-11-30 DIAGNOSIS — R42 Dizziness and giddiness: Secondary | ICD-10-CM | POA: Insufficient documentation

## 2018-11-30 DIAGNOSIS — N10 Acute tubulo-interstitial nephritis: Secondary | ICD-10-CM | POA: Insufficient documentation

## 2018-11-30 DIAGNOSIS — N12 Tubulo-interstitial nephritis, not specified as acute or chronic: Secondary | ICD-10-CM

## 2018-11-30 DIAGNOSIS — R112 Nausea with vomiting, unspecified: Secondary | ICD-10-CM | POA: Insufficient documentation

## 2018-11-30 DIAGNOSIS — Z91040 Latex allergy status: Secondary | ICD-10-CM | POA: Insufficient documentation

## 2018-11-30 DIAGNOSIS — M545 Low back pain: Secondary | ICD-10-CM | POA: Insufficient documentation

## 2018-11-30 LAB — COMPREHENSIVE METABOLIC PANEL
ALT: 16 U/L (ref 0–44)
AST: 18 U/L (ref 15–41)
Albumin: 3.8 g/dL (ref 3.5–5.0)
Alkaline Phosphatase: 41 U/L (ref 38–126)
Anion gap: 11 (ref 5–15)
BUN: <5 mg/dL — ABNORMAL LOW (ref 6–20)
CO2: 19 mmol/L — ABNORMAL LOW (ref 22–32)
Calcium: 9.1 mg/dL (ref 8.9–10.3)
Chloride: 102 mmol/L (ref 98–111)
Creatinine, Ser: 0.73 mg/dL (ref 0.44–1.00)
GFR calc Af Amer: >60 mL/min (ref >60)
GFR calc non Af Amer: >60 mL/min (ref >60)
Glucose, Bld: 111 mg/dL — ABNORMAL HIGH (ref 70–99)
Potassium: 3.4 mmol/L — ABNORMAL LOW (ref 3.5–5.1)
Sodium: 132 mmol/L — ABNORMAL LOW (ref 135–145)
Total Bilirubin: 0.7 mg/dL (ref 0.3–1.2)
Total Protein: 7.5 g/dL (ref 6.5–8.1)

## 2018-11-30 LAB — I-STAT BETA HCG BLOOD, ED (MC, WL, AP ONLY): I-stat hCG, quantitative: <5 m[IU]/mL (ref <5)

## 2018-11-30 LAB — CBC WITH DIFFERENTIAL/PLATELET
Abs Immature Granulocytes: 0.03 10*3/uL (ref 0.00–0.07)
Basophils Absolute: 0 10*3/uL (ref 0.0–0.1)
Basophils Relative: 0 %
Eosinophils Absolute: 0 10*3/uL (ref 0.0–0.5)
Eosinophils Relative: 0 %
HCT: 34.4 % — ABNORMAL LOW (ref 36.0–46.0)
Hemoglobin: 11.5 g/dL — ABNORMAL LOW (ref 12.0–15.0)
Immature Granulocytes: 0 %
Lymphocytes Relative: 7 %
Lymphs Abs: 0.9 10*3/uL (ref 0.7–4.0)
MCH: 32.4 pg (ref 26.0–34.0)
MCHC: 33.4 g/dL (ref 30.0–36.0)
MCV: 96.9 fL (ref 80.0–100.0)
Monocytes Absolute: 0.7 10*3/uL (ref 0.1–1.0)
Monocytes Relative: 6 %
Neutro Abs: 10.7 10*3/uL — ABNORMAL HIGH (ref 1.7–7.7)
Neutrophils Relative %: 87 %
Platelets: 225 10*3/uL (ref 150–400)
RBC: 3.55 MIL/uL — ABNORMAL LOW (ref 3.87–5.11)
RDW: 12.4 % (ref 11.5–15.5)
WBC: 12.3 10*3/uL — ABNORMAL HIGH (ref 4.0–10.5)
nRBC: 0 % (ref 0.0–0.2)

## 2018-11-30 LAB — LIPASE, BLOOD: Lipase: 22 U/L (ref 11–51)

## 2018-11-30 LAB — CK: Total CK: 59 U/L (ref 38–234)

## 2018-11-30 MED ORDER — SODIUM CHLORIDE 0.9 % IV BOLUS
1000.0000 mL | Freq: Once | INTRAVENOUS | Status: AC
  Administered 2018-11-30: 23:00:00 1000 mL via INTRAVENOUS

## 2018-11-30 MED ORDER — ACETAMINOPHEN 500 MG PO TABS
1000.0000 mg | ORAL_TABLET | Freq: Once | ORAL | Status: AC
  Administered 2018-11-30: 1000 mg via ORAL
  Filled 2018-11-30: qty 2

## 2018-11-30 MED ORDER — ONDANSETRON HCL 4 MG/2ML IJ SOLN
4.0000 mg | Freq: Once | INTRAMUSCULAR | Status: AC
  Administered 2018-11-30: 4 mg via INTRAVENOUS
  Filled 2018-11-30: qty 2

## 2018-11-30 NOTE — ED Triage Notes (Signed)
C/o headache, fever, vomiting x 3, generalized body aches, and bilateral ear pain that started today.
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
## 2018-12-01 ENCOUNTER — Emergency Department (HOSPITAL_COMMUNITY): Payer: Self-pay

## 2018-12-01 LAB — URINALYSIS, ROUTINE W REFLEX MICROSCOPIC
Bilirubin Urine: NEGATIVE
Glucose, UA: NEGATIVE mg/dL
Ketones, ur: 80 mg/dL — AB
Nitrite: NEGATIVE
Protein, ur: NEGATIVE mg/dL
Specific Gravity, Urine: 1.036 — ABNORMAL HIGH (ref 1.005–1.030)
pH: 6 (ref 5.0–8.0)

## 2018-12-01 LAB — LACTIC ACID, PLASMA: Lactic Acid, Venous: 0.7 mmol/L (ref 0.5–1.9)

## 2018-12-01 MED ORDER — ONDANSETRON 4 MG PO TBDP: 4.0000 mg | ORAL_TABLET | Freq: Three times a day (TID) | ORAL | 0 refills | Status: DC | PRN

## 2018-12-01 MED ORDER — SULFAMETHOXAZOLE-TRIMETHOPRIM 800-160 MG PO TABS: 1.0000 | ORAL_TABLET | Freq: Two times a day (BID) | ORAL | 0 refills | Status: AC

## 2018-12-01 MED ORDER — SODIUM CHLORIDE 0.9 % IV SOLN
1.0000 g | Freq: Once | INTRAVENOUS | Status: AC
  Administered 2018-12-01: 01:00:00 1 g via INTRAVENOUS
  Filled 2018-12-01: qty 10

## 2018-12-01 MED ORDER — KETOROLAC TROMETHAMINE 30 MG/ML IJ SOLN
30.0000 mg | Freq: Once | INTRAMUSCULAR | Status: AC
  Administered 2018-12-01: 01:00:00 30 mg via INTRAVENOUS
  Filled 2018-12-01: qty 1

## 2018-12-01 MED ORDER — IOHEXOL 300 MG/ML  SOLN
100.0000 mL | Freq: Once | INTRAMUSCULAR | Status: AC | PRN
  Administered 2018-12-01: 100 mL via INTRAVENOUS

## 2018-12-01 NOTE — ED Notes (Signed)
Pt discharged from ED; instructions provided and scripts given; Pt encouraged to return to ED if symptoms worsen and to f/u with PCP; Pt verbalized understanding of all instructions 

## 2018-12-01 NOTE — ED Notes (Signed)
Pt provided with cup of ginger ale; pt tolerated well.

## 2018-12-01 NOTE — Discharge Instructions (Addendum)
Thank you for allowing me to care for you today in the Emergency Department.   Take 1 tablet of Bactrim, starting tomorrow morning, twice daily for the next 14 days.  Continue the entire course of medication even if you start feeling much better to avoid antibiotic resistance.  Let 1 tablet of Zofran dissolve in your tongue every 8 hours as needed for nausea or vomiting.  Take 650 mg of Tylenol or 600 mg of ibuprofen with food once every 6 hours for fever.  You can alternate between these medications every 3 hours if your fever returns.  Overall, you will feel much better if you get good control your fever.  I have listed several primary care clinics that you can contact to see if they are taking new patients to get  established with primary care.  Return to the emergency department if you develop a high fever despite taking Tylenol and ibuprofen as listed above, vomiting despite taking Zofran, severe, uncontrollable pain, or other new, concerning symptoms.

## 2018-12-03 LAB — URINE CULTURE: Culture: >=100000 — AB

## 2018-12-04 ENCOUNTER — Telehealth: Payer: Self-pay | Admitting: Emergency Medicine

## 2018-12-04 NOTE — Telephone Encounter (Signed)
Post ED Visit - Positive Culture Follow-up  Culture report reviewed by antimicrobial stewardship pharmacist: [facility] Team []  Elenor Quinones, Pharm.D. []  Heide Guile, Pharm.D., BCPS AQ-ID []  Parks Neptune, Pharm.D., BCPS []  Alycia Rossetti, Pharm.D., BCPS []  [place], Pharm.D., BCPS, AAHIVP []  Legrand Como, Pharm.D., BCPS, AAHIVP []  Salome Arnt, PharmD, BCPS []  Johnnette Gourd, PharmD, BCPS []  Hughes Better, PharmD, BCPS [x]  Harrietta Guardian, PharmD []  Laqueta Linden, PharmD, BCPS []  Albertina Parr, PharmD  [place] [facility] Team []  Leodis Sias, PharmD []  Lindell Spar, PharmD []  Royetta Asal, PharmD []  Graylin Shiver, Rph []  Rema Fendt) Glennon Mac, PharmD []  Arlyn Dunning, PharmD []  Netta Cedars, PharmD []  Dia Sitter, PharmD []  Leone Haven, PharmD []  Gretta Arab, PharmD []  Theodis Shove, PharmD []  Peggyann Juba, PharmD []  Reuel Boom, PharmD   Positive urine culture Treated with Sulfamethoxazole-trimethoprim, organism sensitive to the same and no further patient follow-up is required at this time.  Larene Beach Jeimy Bickert 12/04/2018, 9:30 AM

## 2018-12-05 LAB — CULTURE, BLOOD (ROUTINE X 2)
Culture: NO GROWTH
Culture: NO GROWTH
Special Requests: ADEQUATE
Special Requests: ADEQUATE

## 2019-03-26 ENCOUNTER — Emergency Department (HOSPITAL_COMMUNITY)
Admission: EM | Admit: 2019-03-26 | Discharge: 2019-03-26 | Disposition: A | Discharge status: Home / Self Care | Payer: Self-pay | Attending: Emergency Medicine | Admitting: Emergency Medicine

## 2019-03-26 ENCOUNTER — Other Ambulatory Visit: Payer: Self-pay

## 2019-03-26 ENCOUNTER — Encounter (HOSPITAL_COMMUNITY): Payer: Self-pay | Admitting: Emergency Medicine

## 2019-03-26 DIAGNOSIS — Z91040 Latex allergy status: Secondary | ICD-10-CM | POA: Insufficient documentation

## 2019-03-26 DIAGNOSIS — G43909 Migraine, unspecified, not intractable, without status migrainosus: Secondary | ICD-10-CM | POA: Insufficient documentation

## 2019-03-26 DIAGNOSIS — Z79899 Other long term (current) drug therapy: Secondary | ICD-10-CM | POA: Insufficient documentation

## 2019-03-26 LAB — I-STAT BETA HCG BLOOD, ED (MC, WL, AP ONLY): I-stat hCG, quantitative: <5 m[IU]/mL (ref <5)

## 2019-03-26 MED ORDER — METOCLOPRAMIDE HCL 5 MG/ML IJ SOLN
10.0000 mg | Freq: Once | INTRAMUSCULAR | Status: AC
  Administered 2019-03-26: 19:00:00 10 mg via INTRAVENOUS
  Filled 2019-03-26: qty 2

## 2019-03-26 MED ORDER — SODIUM CHLORIDE 0.9 % IV BOLUS
1000.0000 mL | Freq: Once | INTRAVENOUS | Status: AC
  Administered 2019-03-26: 19:00:00 1000 mL via INTRAVENOUS

## 2019-03-26 MED ORDER — DIPHENHYDRAMINE HCL 50 MG/ML IJ SOLN
25.0000 mg | Freq: Once | INTRAMUSCULAR | Status: AC
  Administered 2019-03-26: 19:00:00 25 mg via INTRAVENOUS
  Filled 2019-03-26: qty 1

## 2019-03-26 MED ORDER — ONDANSETRON HCL 4 MG/2ML IJ SOLN
4.0000 mg | Freq: Once | INTRAMUSCULAR | Status: AC
  Administered 2019-03-26: 19:00:00 4 mg via INTRAVENOUS
  Filled 2019-03-26: qty 2

## 2019-03-26 NOTE — ED Notes (Signed)
Pt offered fluids and tolerated well

## 2019-03-26 NOTE — ED Notes (Signed)
Pt updated on wait.  States she is going outside to smoke.
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
## 2019-03-26 NOTE — Discharge Instructions (Signed)
Follow-up with referred neurologist.  Return emergency department for any fever, worsening headache, numbness/weakness of arms or legs or any other worsening or concerning symptoms.

## 2019-03-26 NOTE — ED Notes (Signed)
Updated on wait for treatment room. 

## 2019-03-26 NOTE — ED Notes (Signed)
Patient Alert and oriented to baseline. Stable and ambulatory to baseline. Patient verbalized understanding of the discharge instructions.  Patient belongings were taken by the patient.   

## 2019-03-26 NOTE — ED Triage Notes (Signed)
Pt reports a migraine for the past 3 days. Pt reports a history of same that are usually resolved with OTC meds. They are not working today.

## 2020-01-19 ENCOUNTER — Ambulatory Visit: Payer: Self-pay | Admitting: Obstetrics

## 2020-07-09 ENCOUNTER — Other Ambulatory Visit: Payer: Self-pay

## 2020-07-09 ENCOUNTER — Ambulatory Visit (INDEPENDENT_AMBULATORY_CARE_PROVIDER_SITE_OTHER): Payer: Self-pay | Admitting: Obstetrics

## 2020-07-09 ENCOUNTER — Encounter: Payer: Self-pay | Admitting: Obstetrics

## 2020-07-09 VITALS — BP 130/84 | HR 74 | Ht 62.0 in | Wt 185.0 lb

## 2020-07-09 DIAGNOSIS — Z3046 Encounter for surveillance of implantable subdermal contraceptive: Secondary | ICD-10-CM

## 2020-07-09 NOTE — Progress Notes (Signed)
[facility] REMOVAL NOTE  Date of LMP:   unknown  Contraception used: *Nexplanon   Indications:  The patient desires removal of Nexplanon.  She understands risks, benefits, and alternatives to Nexplanon and would like to proceed.  Anesthesia:   Lidocaine 1% plain.  Procedure:  A time-out was performed confirming the procedure and the patient's allergy status.  Complications: None                      The rod was palpated and the area was sterilely prepped.  The area beneath the distal tip was anesthetized with 1% xylocaine and the skin incised                       Over the tip and the tip was exposed, grasped with forcep and removed intact.   Steri strips applied to incision.                      And a bandage applied and the arm was wrapped with gauze bandage.  The patient tolerated well.  Instructions:  The patient was instructed to remove the dressing in 24 hours and that some bruising is to be expected.  She was advised to use over the counter analgesics as needed for any pain at the site.  She is to keep the area dry for 24 hours and to call if her hand or arm becomes cold, numb, or blue.  Return visit:  Return in 2 weeks   Shelly Bombard, MD 07/09/2020 3:19 PM

## 2020-07-09 NOTE — Progress Notes (Addendum)
GYN presents for Nexplanon Removal it was inserted 05/04/2016.  Patient needs AEX/PAP, she will be sent to the [facility].

## 2020-07-23 ENCOUNTER — Telehealth: Payer: Self-pay | Admitting: Obstetrics

## 2020-09-02 ENCOUNTER — Ambulatory Visit: Payer: Self-pay

## 2021-05-30 ENCOUNTER — Other Ambulatory Visit: Payer: Self-pay

## 2021-05-30 ENCOUNTER — Ambulatory Visit (HOSPITAL_COMMUNITY)
Admission: EM | Admit: 2021-05-30 | Discharge: 2021-05-30 | Disposition: A | Discharge status: Home / Self Care | Payer: Self-pay | Attending: Emergency Medicine | Admitting: Emergency Medicine

## 2021-05-30 ENCOUNTER — Encounter (HOSPITAL_COMMUNITY): Payer: Self-pay

## 2021-05-30 DIAGNOSIS — L509 Urticaria, unspecified: Secondary | ICD-10-CM

## 2021-05-30 DIAGNOSIS — K0889 Other specified disorders of teeth and supporting structures: Secondary | ICD-10-CM

## 2021-05-30 MED ORDER — CLINDAMYCIN HCL 150 MG PO CAPS: 150.0000 mg | ORAL_CAPSULE | Freq: Three times a day (TID) | ORAL | 0 refills | Status: AC

## 2021-05-30 MED ORDER — METHYLPREDNISOLONE SODIUM SUCC 125 MG IJ SOLR
INTRAMUSCULAR | Status: AC
  Filled 2021-05-30: qty 2

## 2021-05-30 MED ORDER — METHYLPREDNISOLONE SODIUM SUCC 125 MG IJ SOLR
60.0000 mg | Freq: Once | INTRAMUSCULAR | Status: AC
  Administered 2021-05-30: 60 mg via INTRAMUSCULAR

## 2021-05-30 MED ORDER — METRONIDAZOLE 500 MG PO TABS: 500.0000 mg | ORAL_TABLET | Freq: Two times a day (BID) | ORAL | 0 refills | Status: DC
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
## 2021-05-30 NOTE — Discharge Instructions (Addendum)
Take clindamycin 3 times a day for the next 7 days  Take metronidazole twice a day for the next 7 days  You have been given a steroid injection here in office to help with your rash, it will also help with the swelling of your face and gums  You can take 800 mg of ibuprofen 3 times a day (every 8 hours) for comfort  Inside your packet are some additional dental resources that you may reach out to you if you have not called already  Follow-up with urgent care as needed

## 2021-05-30 NOTE — ED Triage Notes (Signed)
Pt presents with c/o dental pain and swelling on the L side of the face. States she started developing hives all around her body. States she has not taken anything today. States she has taken OTC pain medicine.

## 2021-07-02 ENCOUNTER — Other Ambulatory Visit: Payer: Self-pay

## 2021-07-02 ENCOUNTER — Emergency Department (HOSPITAL_COMMUNITY)
Admission: EM | Admit: 2021-07-02 | Discharge: 2021-07-03 | Disposition: A | Discharge status: Home / Self Care | Payer: No Typology Code available for payment source | Attending: Emergency Medicine | Admitting: Emergency Medicine

## 2021-07-02 ENCOUNTER — Encounter (HOSPITAL_COMMUNITY): Payer: Self-pay | Admitting: Emergency Medicine

## 2021-07-02 DIAGNOSIS — N9489 Other specified conditions associated with female genital organs and menstrual cycle: Secondary | ICD-10-CM | POA: Insufficient documentation

## 2021-07-02 DIAGNOSIS — R519 Headache, unspecified: Secondary | ICD-10-CM | POA: Diagnosis present

## 2021-07-02 DIAGNOSIS — R110 Nausea: Secondary | ICD-10-CM | POA: Insufficient documentation

## 2021-07-02 DIAGNOSIS — Z87891 Personal history of nicotine dependence: Secondary | ICD-10-CM | POA: Insufficient documentation

## 2021-07-02 DIAGNOSIS — J45909 Unspecified asthma, uncomplicated: Secondary | ICD-10-CM | POA: Insufficient documentation

## 2021-07-02 DIAGNOSIS — Z77098 Contact with and (suspected) exposure to other hazardous, chiefly nonmedicinal, chemicals: Secondary | ICD-10-CM | POA: Insufficient documentation

## 2021-07-02 DIAGNOSIS — Z7729 Contact with and (suspected ) exposure to other hazardous substances: Secondary | ICD-10-CM

## 2021-07-02 DIAGNOSIS — R42 Dizziness and giddiness: Secondary | ICD-10-CM | POA: Insufficient documentation

## 2021-07-02 DIAGNOSIS — Z91040 Latex allergy status: Secondary | ICD-10-CM | POA: Diagnosis not present

## 2021-07-02 LAB — I-STAT BETA HCG BLOOD, ED (MC, WL, AP ONLY): I-stat hCG, quantitative: <5 m[IU]/mL (ref <5)

## 2021-07-02 MED ORDER — SODIUM CHLORIDE 0.9 % IV BOLUS
1000.0000 mL | Freq: Once | INTRAVENOUS | Status: AC
  Administered 2021-07-03: 1000 mL via INTRAVENOUS

## 2021-07-02 MED ORDER — KETOROLAC TROMETHAMINE 30 MG/ML IJ SOLN
30.0000 mg | Freq: Once | INTRAMUSCULAR | Status: AC
  Administered 2021-07-03: 30 mg via INTRAVENOUS
  Filled 2021-07-02: qty 1

## 2021-07-02 MED ORDER — DIPHENHYDRAMINE HCL 50 MG/ML IJ SOLN
25.0000 mg | Freq: Once | INTRAMUSCULAR | Status: AC
  Administered 2021-07-03: 25 mg via INTRAVENOUS
  Filled 2021-07-02: qty 1

## 2021-07-02 MED ORDER — METOCLOPRAMIDE HCL 5 MG/ML IJ SOLN
10.0000 mg | Freq: Once | INTRAMUSCULAR | Status: AC
  Administered 2021-07-03: 10 mg via INTRAVENOUS
  Filled 2021-07-02: qty 2
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
## 2021-07-02 NOTE — ED Triage Notes (Signed)
Per EMS pt at work with smell of gas. Patient having headache and nausea. Fire dept showed low reading of carbon monoxide in store.

## 2021-07-02 NOTE — ED Notes (Signed)
Patient states she feels she no longer needs oxygen. States she will put it back on if she feels she needs it

## 2021-07-02 NOTE — ED Notes (Signed)
Patient states she is no longer going to wear o

## 2021-07-03 LAB — BASIC METABOLIC PANEL
Anion gap: 11 (ref 5–15)
BUN: 12 mg/dL (ref 6–20)
CO2: 21 mmol/L — ABNORMAL LOW (ref 22–32)
Calcium: 8.4 mg/dL — ABNORMAL LOW (ref 8.9–10.3)
Chloride: 104 mmol/L (ref 98–111)
Creatinine, Ser: 0.65 mg/dL (ref 0.44–1.00)
GFR, Estimated: >60 mL/min (ref >60)
Glucose, Bld: 90 mg/dL (ref 70–99)
Potassium: 3.2 mmol/L — ABNORMAL LOW (ref 3.5–5.1)
Sodium: 136 mmol/L (ref 135–145)

## 2021-07-03 LAB — CBC WITH DIFFERENTIAL/PLATELET
Abs Immature Granulocytes: 0.01 10*3/uL (ref 0.00–0.07)
Basophils Absolute: 0 10*3/uL (ref 0.0–0.1)
Basophils Relative: 0 %
Eosinophils Absolute: 0.1 10*3/uL (ref 0.0–0.5)
Eosinophils Relative: 2 %
HCT: 37.7 % (ref 36.0–46.0)
Hemoglobin: 12.7 g/dL (ref 12.0–15.0)
Immature Granulocytes: 0 %
Lymphocytes Relative: 58 %
Lymphs Abs: 3.4 10*3/uL (ref 0.7–4.0)
MCH: 31.7 pg (ref 26.0–34.0)
MCHC: 33.7 g/dL (ref 30.0–36.0)
MCV: 94 fL (ref 80.0–100.0)
Monocytes Absolute: 0.5 10*3/uL (ref 0.1–1.0)
Monocytes Relative: 9 %
Neutro Abs: 1.9 10*3/uL (ref 1.7–7.7)
Neutrophils Relative %: 31 %
Platelets: 256 10*3/uL (ref 150–400)
RBC: 4.01 MIL/uL (ref 3.87–5.11)
RDW: 12.8 % (ref 11.5–15.5)
WBC: 6.1 10*3/uL (ref 4.0–10.5)
nRBC: 0 % (ref 0.0–0.2)

## 2021-07-03 LAB — COOXEMETRY PANEL
Carboxyhemoglobin: 0.6 % (ref 0.5–1.5)
Methemoglobin: 0.8 % (ref 0.0–1.5)
O2 Saturation: 95.3 %
Total hemoglobin: 12.3 g/dL (ref 12.0–16.0)

## 2021-07-03 MED ORDER — POTASSIUM CHLORIDE CRYS ER 20 MEQ PO TBCR
40.0000 meq | EXTENDED_RELEASE_TABLET | Freq: Once | ORAL | Status: AC
  Administered 2021-07-03: 40 meq via ORAL
  Filled 2021-07-03: qty 2

## 2021-07-03 NOTE — Discharge Instructions (Signed)
Your work-up is reassuring.  Use Tylenol or Motrin as needed for pain.  Return to the ED with severe unilateral headache, unusual headache, focal weakness, numbness, tingling, difficulty speaking or difficulty swallowing or any other concerns.

## 2021-10-24 ENCOUNTER — Encounter (HOSPITAL_COMMUNITY): Payer: Self-pay | Admitting: Obstetrics and Gynecology

## 2021-10-24 ENCOUNTER — Inpatient Hospital Stay (HOSPITAL_COMMUNITY)
Admission: AD | Admit: 2021-10-24 | Discharge: 2021-10-24 | Disposition: A | Discharge status: Home / Self Care | Payer: Self-pay | Attending: Obstetrics and Gynecology | Admitting: Obstetrics and Gynecology

## 2021-10-24 ENCOUNTER — Inpatient Hospital Stay (HOSPITAL_COMMUNITY): Payer: Self-pay

## 2021-10-24 ENCOUNTER — Other Ambulatory Visit: Payer: Self-pay

## 2021-10-24 DIAGNOSIS — M549 Dorsalgia, unspecified: Secondary | ICD-10-CM | POA: Insufficient documentation

## 2021-10-24 DIAGNOSIS — N8312 Corpus luteum cyst of left ovary: Secondary | ICD-10-CM | POA: Insufficient documentation

## 2021-10-24 DIAGNOSIS — R112 Nausea with vomiting, unspecified: Secondary | ICD-10-CM | POA: Insufficient documentation

## 2021-10-24 DIAGNOSIS — Z3490 Encounter for supervision of normal pregnancy, unspecified, unspecified trimester: Secondary | ICD-10-CM

## 2021-10-24 DIAGNOSIS — Z3A10 10 weeks gestation of pregnancy: Secondary | ICD-10-CM | POA: Insufficient documentation

## 2021-10-24 DIAGNOSIS — R1031 Right lower quadrant pain: Secondary | ICD-10-CM | POA: Insufficient documentation

## 2021-10-24 DIAGNOSIS — O26891 Other specified pregnancy related conditions, first trimester: Secondary | ICD-10-CM | POA: Insufficient documentation

## 2021-10-24 DIAGNOSIS — O3481 Maternal care for other abnormalities of pelvic organs, first trimester: Secondary | ICD-10-CM | POA: Insufficient documentation

## 2021-10-24 LAB — COMPREHENSIVE METABOLIC PANEL
ALT: 15 U/L (ref 0–44)
AST: 14 U/L — ABNORMAL LOW (ref 15–41)
Albumin: 3.9 g/dL (ref 3.5–5.0)
Alkaline Phosphatase: 43 U/L (ref 38–126)
Anion gap: 8 (ref 5–15)
BUN: 10 mg/dL (ref 6–20)
CO2: 22 mmol/L (ref 22–32)
Calcium: 9.4 mg/dL (ref 8.9–10.3)
Chloride: 106 mmol/L (ref 98–111)
Creatinine, Ser: 0.62 mg/dL (ref 0.44–1.00)
GFR, Estimated: >60 mL/min (ref >60)
Glucose, Bld: 91 mg/dL (ref 70–99)
Potassium: 4.3 mmol/L (ref 3.5–5.1)
Sodium: 136 mmol/L (ref 135–145)
Total Bilirubin: 0.3 mg/dL (ref 0.3–1.2)
Total Protein: 7 g/dL (ref 6.5–8.1)

## 2021-10-24 LAB — WET PREP, GENITAL
Clue Cells Wet Prep HPF POC: NONE SEEN
Sperm: NONE SEEN
Trich, Wet Prep: NONE SEEN
WBC, Wet Prep HPF POC: <10 (ref <10)
Yeast Wet Prep HPF POC: NONE SEEN

## 2021-10-24 LAB — CBC
HCT: 35.9 % — ABNORMAL LOW (ref 36.0–46.0)
Hemoglobin: 12.2 g/dL (ref 12.0–15.0)
MCH: 32 pg (ref 26.0–34.0)
MCHC: 34 g/dL (ref 30.0–36.0)
MCV: 94.2 fL (ref 80.0–100.0)
Platelets: 299 10*3/uL (ref 150–400)
RBC: 3.81 MIL/uL — ABNORMAL LOW (ref 3.87–5.11)
RDW: 13 % (ref 11.5–15.5)
WBC: 9.8 10*3/uL (ref 4.0–10.5)
nRBC: 0 % (ref 0.0–0.2)

## 2021-10-24 LAB — URINALYSIS, ROUTINE W REFLEX MICROSCOPIC
Bilirubin Urine: NEGATIVE
Glucose, UA: NEGATIVE mg/dL
Ketones, ur: NEGATIVE mg/dL
Leukocytes,Ua: NEGATIVE
Nitrite: NEGATIVE
Protein, ur: NEGATIVE mg/dL
Specific Gravity, Urine: 1.023 (ref 1.005–1.030)
pH: 5 (ref 5.0–8.0)

## 2021-10-24 LAB — HCG, QUANTITATIVE, PREGNANCY: hCG, Beta Chain, Quant, S: 7094 m[IU]/mL — ABNORMAL HIGH (ref <5)

## 2021-10-24 LAB — POCT PREGNANCY, URINE: Preg Test, Ur: POSITIVE — AB

## 2021-10-24 MED ORDER — PREPLUS 27-1 MG PO TABS: 1.0000 | ORAL_TABLET | Freq: Every day | ORAL | 13 refills | Status: AC

## 2021-10-24 MED ORDER — PROMETHAZINE HCL 12.5 MG PO TABS: 12.5000 mg | ORAL_TABLET | Freq: Four times a day (QID) | ORAL | 0 refills | Status: DC | PRN

## 2021-10-24 NOTE — MAU Note (Signed)
PT SAYS CRAMPING  AND LOWER BACK PAIN IN  STARTED THIS AM ?FEELS NAUSEA  ? ? ?

## 2021-10-24 NOTE — Discharge Instructions (Signed)
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
## 2021-10-27 LAB — GC/CHLAMYDIA PROBE AMP (~~LOC~~) NOT AT ARMC
Chlamydia: NEGATIVE
Comment: NEGATIVE
Comment: NORMAL
Neisseria Gonorrhea: NEGATIVE

## 2021-10-27 LAB — CULTURE, OB URINE: Culture: 50000 — AB

## 2021-10-28 ENCOUNTER — Other Ambulatory Visit: Payer: Self-pay | Admitting: Advanced Practice Midwife

## 2021-10-28 DIAGNOSIS — N3000 Acute cystitis without hematuria: Secondary | ICD-10-CM

## 2021-10-28 MED ORDER — NITROFURANTOIN MONOHYD MACRO 100 MG PO CAPS: 100.0000 mg | ORAL_CAPSULE | Freq: Two times a day (BID) | ORAL | 0 refills | Status: DC

## 2021-10-28 NOTE — Progress Notes (Addendum)
+   UTI on urine culture. Patient called at home. Legitimate PCN allergy, no knowledge of Cephalosporin exposure. Listed a [redacted] weeks pregnant per chart but had GS + YS on imaging from 10/24/2021. Will give Macrobid. Risks discussed with patient during phone call. Patient agreeable. Pharmacy confirmed. Pharmacy contacted by CNM to confirm given very early IUP and PCN allergy patient is safe for Macrobid. Spoke with Corene Cornea in pharmacy, who confirmed they will fill rx. ? ?[name], [place], MSN, CNM ?Certified Nurse Midwife, [place] ?[facility] ? ?

## 2021-12-07 ENCOUNTER — Other Ambulatory Visit: Payer: Self-pay

## 2021-12-07 ENCOUNTER — Inpatient Hospital Stay (HOSPITAL_COMMUNITY)
Admission: AD | Admit: 2021-12-07 | Discharge: 2021-12-08 | Disposition: A | Discharge status: Home / Self Care | Payer: Medicaid Other | Attending: Obstetrics & Gynecology | Admitting: Obstetrics & Gynecology

## 2021-12-07 DIAGNOSIS — O2242 Hemorrhoids in pregnancy, second trimester: Secondary | ICD-10-CM | POA: Insufficient documentation

## 2021-12-07 DIAGNOSIS — R12 Heartburn: Secondary | ICD-10-CM | POA: Insufficient documentation

## 2021-12-07 DIAGNOSIS — O368390 Maternal care for abnormalities of the fetal heart rate or rhythm, unspecified trimester, not applicable or unspecified: Secondary | ICD-10-CM

## 2021-12-07 DIAGNOSIS — O2241 Hemorrhoids in pregnancy, first trimester: Secondary | ICD-10-CM

## 2021-12-07 DIAGNOSIS — Z3A12 12 weeks gestation of pregnancy: Secondary | ICD-10-CM | POA: Insufficient documentation

## 2021-12-07 DIAGNOSIS — O99612 Diseases of the digestive system complicating pregnancy, second trimester: Secondary | ICD-10-CM | POA: Insufficient documentation

## 2021-12-07 DIAGNOSIS — O219 Vomiting of pregnancy, unspecified: Secondary | ICD-10-CM | POA: Insufficient documentation

## 2021-12-07 DIAGNOSIS — O26891 Other specified pregnancy related conditions, first trimester: Secondary | ICD-10-CM

## 2021-12-07 NOTE — MAU Note (Signed)
.  [name] is a 31 y.o. at 34w1dhere in MAU reporting: 2 hemorrhoids that are painful took Tylenol, witch hazel and hemorrhoidal cream and headache from infection in R upper gum.Vomiting with all food ingested and fluids. Reports seeing spots of blood in emesis. ?With deep breath or certain movements pt states she has a sharp pain in her LL abdomen at cs scar. Pt also has excessive gas in upper abdomen ? ?Pain score:8 head ?Abdominal 7 ?Hunger pain 10 ?Hemorrhoids 10 ?Vitals:  ? 12/07/21 2343  ?BP: 126/67  ?Pulse: 63  ?Resp: 18  ?Temp: 98.6 ?F (37 ?C)  ?SpO2: 100%  ?   ?[id number] obtain in sitting position ?Lab orders placed from triage:  UA ? ?

## 2021-12-08 ENCOUNTER — Encounter (HOSPITAL_COMMUNITY): Payer: Self-pay | Admitting: Obstetrics & Gynecology

## 2021-12-08 DIAGNOSIS — O99612 Diseases of the digestive system complicating pregnancy, second trimester: Secondary | ICD-10-CM | POA: Diagnosis present

## 2021-12-08 DIAGNOSIS — O219 Vomiting of pregnancy, unspecified: Secondary | ICD-10-CM

## 2021-12-08 DIAGNOSIS — Z3A12 12 weeks gestation of pregnancy: Secondary | ICD-10-CM

## 2021-12-08 DIAGNOSIS — O2241 Hemorrhoids in pregnancy, first trimester: Secondary | ICD-10-CM

## 2021-12-08 DIAGNOSIS — O26891 Other specified pregnancy related conditions, first trimester: Secondary | ICD-10-CM | POA: Diagnosis not present

## 2021-12-08 DIAGNOSIS — O2242 Hemorrhoids in pregnancy, second trimester: Secondary | ICD-10-CM | POA: Diagnosis not present

## 2021-12-08 DIAGNOSIS — O368390 Maternal care for abnormalities of the fetal heart rate or rhythm, unspecified trimester, not applicable or unspecified: Secondary | ICD-10-CM

## 2021-12-08 DIAGNOSIS — R12 Heartburn: Secondary | ICD-10-CM | POA: Diagnosis not present

## 2021-12-08 LAB — URINALYSIS, ROUTINE W REFLEX MICROSCOPIC
Bacteria, UA: NONE SEEN
Bilirubin Urine: NEGATIVE
Glucose, UA: NEGATIVE mg/dL
Ketones, ur: 80 mg/dL — AB
Leukocytes,Ua: NEGATIVE
Nitrite: NEGATIVE
Protein, ur: NEGATIVE mg/dL
Specific Gravity, Urine: 1.026 (ref 1.005–1.030)
pH: 5 (ref 5.0–8.0)

## 2021-12-08 MED ORDER — METOCLOPRAMIDE HCL 10 MG PO TABS: 10.0000 mg | ORAL_TABLET | Freq: Three times a day (TID) | ORAL | 0 refills | Status: DC | PRN

## 2021-12-08 MED ORDER — FAMOTIDINE IN NACL 20-0.9 MG/50ML-% IV SOLN
20.0000 mg | Freq: Once | INTRAVENOUS | Status: AC
  Administered 2021-12-08: 20 mg via INTRAVENOUS
  Filled 2021-12-08: qty 50

## 2021-12-08 MED ORDER — LACTATED RINGERS IV BOLUS
1000.0000 mL | Freq: Once | INTRAVENOUS | Status: AC
  Administered 2021-12-08: 1000 mL via INTRAVENOUS

## 2021-12-08 MED ORDER — FAMOTIDINE 20 MG PO TABS: 20.0000 mg | ORAL_TABLET | Freq: Every day | ORAL | 0 refills | Status: AC

## 2021-12-08 MED ORDER — METOCLOPRAMIDE HCL 5 MG/ML IJ SOLN
10.0000 mg | Freq: Once | INTRAMUSCULAR | Status: AC
  Administered 2021-12-08: 10 mg via INTRAVENOUS
  Filled 2021-12-08: qty 2

## 2021-12-08 MED ORDER — LIDOCAINE HCL URETHRAL/MUCOSAL 2 % EX GEL
1.0000 "application " | Freq: Once | CUTANEOUS | Status: AC
  Administered 2021-12-08: 1 via TOPICAL
  Filled 2021-12-08: qty 6
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
## 2021-12-08 NOTE — Discharge Instructions (Signed)

## 2022-01-18 ENCOUNTER — Inpatient Hospital Stay (HOSPITAL_COMMUNITY): Payer: Medicaid Other

## 2022-01-18 ENCOUNTER — Encounter (HOSPITAL_COMMUNITY): Payer: Self-pay | Admitting: Obstetrics & Gynecology

## 2022-01-18 ENCOUNTER — Inpatient Hospital Stay (HOSPITAL_COMMUNITY)
Admission: AD | Admit: 2022-01-18 | Discharge: 2022-01-18 | Disposition: A | Discharge status: Home / Self Care | Payer: Medicaid Other | Attending: Obstetrics & Gynecology | Admitting: Obstetrics & Gynecology

## 2022-01-18 DIAGNOSIS — Z20822 Contact with and (suspected) exposure to covid-19: Secondary | ICD-10-CM | POA: Insufficient documentation

## 2022-01-18 DIAGNOSIS — A084 Viral intestinal infection, unspecified: Secondary | ICD-10-CM | POA: Diagnosis present

## 2022-01-18 DIAGNOSIS — O3430 Maternal care for cervical incompetence, unspecified trimester: Secondary | ICD-10-CM | POA: Diagnosis present

## 2022-01-18 DIAGNOSIS — O218 Other vomiting complicating pregnancy: Secondary | ICD-10-CM | POA: Diagnosis present

## 2022-01-18 DIAGNOSIS — O26892 Other specified pregnancy related conditions, second trimester: Secondary | ICD-10-CM | POA: Diagnosis not present

## 2022-01-18 DIAGNOSIS — O99512 Diseases of the respiratory system complicating pregnancy, second trimester: Secondary | ICD-10-CM | POA: Insufficient documentation

## 2022-01-18 DIAGNOSIS — O219 Vomiting of pregnancy, unspecified: Secondary | ICD-10-CM | POA: Insufficient documentation

## 2022-01-18 DIAGNOSIS — Z3A17 17 weeks gestation of pregnancy: Secondary | ICD-10-CM

## 2022-01-18 LAB — COMPREHENSIVE METABOLIC PANEL
ALT: 29 U/L (ref 0–44)
AST: 24 U/L (ref 15–41)
Albumin: 2.9 g/dL — ABNORMAL LOW (ref 3.5–5.0)
Alkaline Phosphatase: 66 U/L (ref 38–126)
Anion gap: 11 (ref 5–15)
BUN: <5 mg/dL — ABNORMAL LOW (ref 6–20)
CO2: 17 mmol/L — ABNORMAL LOW (ref 22–32)
Calcium: 8.4 mg/dL — ABNORMAL LOW (ref 8.9–10.3)
Chloride: 104 mmol/L (ref 98–111)
Creatinine, Ser: 0.57 mg/dL (ref 0.44–1.00)
GFR, Estimated: >60 mL/min (ref >60)
Glucose, Bld: 102 mg/dL — ABNORMAL HIGH (ref 70–99)
Potassium: 3.3 mmol/L — ABNORMAL LOW (ref 3.5–5.1)
Sodium: 132 mmol/L — ABNORMAL LOW (ref 135–145)
Total Bilirubin: 0.7 mg/dL (ref 0.3–1.2)
Total Protein: 6.5 g/dL (ref 6.5–8.1)

## 2022-01-18 LAB — CBC WITH DIFFERENTIAL/PLATELET
Abs Immature Granulocytes: 0.12 10*3/uL — ABNORMAL HIGH (ref 0.00–0.07)
Basophils Absolute: 0 10*3/uL (ref 0.0–0.1)
Basophils Relative: 0 %
Eosinophils Absolute: 0 10*3/uL (ref 0.0–0.5)
Eosinophils Relative: 0 %
HCT: 33.6 % — ABNORMAL LOW (ref 36.0–46.0)
Hemoglobin: 11.7 g/dL — ABNORMAL LOW (ref 12.0–15.0)
Immature Granulocytes: 1 %
Lymphocytes Relative: 7 %
Lymphs Abs: 1.2 10*3/uL (ref 0.7–4.0)
MCH: 32.3 pg (ref 26.0–34.0)
MCHC: 34.8 g/dL (ref 30.0–36.0)
MCV: 92.8 fL (ref 80.0–100.0)
Monocytes Absolute: 0.8 10*3/uL (ref 0.1–1.0)
Monocytes Relative: 5 %
Neutro Abs: 14.2 10*3/uL — ABNORMAL HIGH (ref 1.7–7.7)
Neutrophils Relative %: 87 %
Platelets: 238 10*3/uL (ref 150–400)
RBC: 3.62 MIL/uL — ABNORMAL LOW (ref 3.87–5.11)
RDW: 12.7 % (ref 11.5–15.5)
WBC: 16.3 10*3/uL — ABNORMAL HIGH (ref 4.0–10.5)
nRBC: 0 % (ref 0.0–0.2)

## 2022-01-18 LAB — URINALYSIS, ROUTINE W REFLEX MICROSCOPIC
Bilirubin Urine: NEGATIVE
Glucose, UA: NEGATIVE mg/dL
Ketones, ur: 80 mg/dL — AB
Leukocytes,Ua: NEGATIVE
Nitrite: NEGATIVE
Protein, ur: NEGATIVE mg/dL
Specific Gravity, Urine: 1.017 (ref 1.005–1.030)
pH: 6 (ref 5.0–8.0)

## 2022-01-18 LAB — GROUP A STREP BY PCR: Group A Strep by PCR: NOT DETECTED

## 2022-01-18 LAB — LACTIC ACID, PLASMA
Lactic Acid, Venous: 0.9 mmol/L (ref 0.5–1.9)
Lactic Acid, Venous: 1.1 mmol/L (ref 0.5–1.9)

## 2022-01-18 LAB — APTT: aPTT: 29 seconds (ref 24–36)

## 2022-01-18 LAB — PROTIME-INR
INR: 1 (ref 0.8–1.2)
Prothrombin Time: 13.3 seconds (ref 11.4–15.2)

## 2022-01-18 LAB — RESP PANEL BY RT-PCR (FLU A&B, COVID) ARPGX2
Influenza A by PCR: NEGATIVE
Influenza B by PCR: NEGATIVE
SARS Coronavirus 2 by RT PCR: NEGATIVE

## 2022-01-18 MED ORDER — SCOPOLAMINE 1 MG/3DAYS TD PT72
1.0000 | MEDICATED_PATCH | TRANSDERMAL | Status: DC
  Administered 2022-01-18: 1.5 mg via TRANSDERMAL
  Filled 2022-01-18: qty 1

## 2022-01-18 MED ORDER — ACETAMINOPHEN-CAFFEINE 500-65 MG PO TABS
2.0000 | ORAL_TABLET | Freq: Once | ORAL | Status: AC
  Administered 2022-01-18: 2 via ORAL
  Filled 2022-01-18: qty 2

## 2022-01-18 MED ORDER — LACTATED RINGERS IV BOLUS (SEPSIS): 1000.0000 mL | Freq: Once | INTRAVENOUS | Status: DC

## 2022-01-18 MED ORDER — LACTATED RINGERS IV SOLN: INTRAVENOUS | Status: DC

## 2022-01-18 MED ORDER — BUTORPHANOL TARTRATE 2 MG/ML IJ SOLN: 2.0000 mg | Freq: Once | INTRAMUSCULAR | Status: DC

## 2022-01-18 MED ORDER — PROMETHAZINE HCL 25 MG PO TABS: 25.0000 mg | ORAL_TABLET | Freq: Four times a day (QID) | ORAL | 0 refills | Status: AC | PRN

## 2022-01-18 MED ORDER — LACTATED RINGERS IV BOLUS (SEPSIS)
1000.0000 mL | Freq: Once | INTRAVENOUS | Status: AC
  Administered 2022-01-18: 1000 mL via INTRAVENOUS

## 2022-01-18 MED ORDER — ONDANSETRON 8 MG PO TBDP: 8.0000 mg | ORAL_TABLET | Freq: Three times a day (TID) | ORAL | 0 refills | Status: AC | PRN

## 2022-01-18 MED ORDER — FAMOTIDINE IN NACL 20-0.9 MG/50ML-% IV SOLN
20.0000 mg | Freq: Once | INTRAVENOUS | Status: AC
  Administered 2022-01-18: 20 mg via INTRAVENOUS
  Filled 2022-01-18: qty 50

## 2022-01-18 MED ORDER — METOCLOPRAMIDE HCL 5 MG/ML IJ SOLN
10.0000 mg | Freq: Once | INTRAMUSCULAR | Status: AC
  Administered 2022-01-18: 10 mg via INTRAVENOUS
  Filled 2022-01-18: qty 2

## 2022-01-18 MED ORDER — LACTATED RINGERS IV BOLUS
1000.0000 mL | Freq: Once | INTRAVENOUS | Status: AC
Start: 2022-01-18 — End: 2022-01-18
  Administered 2022-01-18: 1000 mL via INTRAVENOUS

## 2022-01-18 MED ORDER — VANCOMYCIN HCL IN DEXTROSE 1-5 GM/200ML-% IV SOLN
1000.0000 mg | Freq: Once | INTRAVENOUS | Status: DC
  Filled 2022-01-18: qty 200

## 2022-01-18 MED ORDER — METOCLOPRAMIDE HCL 10 MG PO TABS: 10.0000 mg | ORAL_TABLET | Freq: Three times a day (TID) | ORAL | 0 refills | Status: AC | PRN

## 2022-01-18 MED ORDER — CYCLOBENZAPRINE HCL 5 MG PO TABS
10.0000 mg | ORAL_TABLET | Freq: Once | ORAL | Status: AC
  Administered 2022-01-18: 10 mg via ORAL
  Filled 2022-01-18: qty 2

## 2022-01-18 MED ORDER — SODIUM CHLORIDE 0.9 % IV SOLN
2.0000 g | Freq: Once | INTRAVENOUS | Status: DC
  Filled 2022-01-18: qty 10

## 2022-01-18 MED ORDER — DIPHENHYDRAMINE HCL 50 MG/ML IJ SOLN
25.0000 mg | INTRAMUSCULAR | Status: AC
  Administered 2022-01-18: 25 mg via INTRAVENOUS
  Filled 2022-01-18: qty 1

## 2022-01-18 MED ORDER — NALBUPHINE HCL 10 MG/ML IJ SOLN
10.0000 mg | Freq: Once | INTRAMUSCULAR | Status: AC
  Administered 2022-01-18: 10 mg via INTRAVENOUS
  Filled 2022-01-18: qty 1

## 2022-01-18 MED ORDER — METRONIDAZOLE 500 MG/100ML IV SOLN
500.0000 mg | Freq: Two times a day (BID) | INTRAVENOUS | Status: DC
  Filled 2022-01-18: qty 100

## 2022-01-18 MED ORDER — SCOPOLAMINE 1 MG/3DAYS TD PT72: 1.0000 | MEDICATED_PATCH | TRANSDERMAL | 12 refills | Status: AC

## 2022-01-18 MED ORDER — SODIUM CHLORIDE 0.9 % IV SOLN
8.0000 mg | Freq: Once | INTRAVENOUS | Status: AC
  Administered 2022-01-18: 8 mg via INTRAVENOUS
  Filled 2022-01-18: qty 4

## 2022-01-18 NOTE — Progress Notes (Signed)
Patient was given a sandwich and ginger ale. Patient stated that she ate a little bit of the sandwich but threw it up, She was able to tolerate po fluids. Provider aware.

## 2022-01-18 NOTE — Progress Notes (Signed)
Patient complained of nausea and a burning feeling. Provider aware.

## 2022-01-18 NOTE — Sepsis Progress Note (Signed)
Elink following code sepsis °

## 2022-01-18 NOTE — Progress Notes (Signed)
Antibiotics were discontinued. Current provider was notified. No new orders at this time. Will continue to follow code sepsis.
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
## 2022-01-18 NOTE — MAU Note (Signed)
.  [name] is a 31 y.o. at 58w6dhere in MAU reporting: flu like s/s HA, ear pain, sore throat, fever, N/V unable to keep anything down, and joint pain since Friday afternoon..  Pt reports her fever was 103.2 about an hour ago. Pt reports trying to take some tylenol but unable to keep it in. Pt states she is trying to stay hydrate with Gatorade and water, but vomited about 7 times today. Pt denies VB, LOF, abnormal discharge, exposure to anyone with illness/COVID/Flu, and complications in the pregnancy.   Onset of complaint: Friday Pain score: 10/10 HA, back and leg 7/10 Vitals:   01/18/22 0136  BP: 126/62  Pulse: (!) 112  Resp: (!) 22  Temp: (!) 101.7 F (38.7 C)  SpO2: 99%     FHT:175 Lab orders placed from triage:  UA

## 2022-01-18 NOTE — Discharge Instructions (Signed)

## 2022-01-19 LAB — BLOOD CULTURE ID PANEL (REFLEXED) - BCID2

## 2022-01-19 NOTE — Progress Notes (Signed)
PHARMACY - PHYSICIAN COMMUNICATION CRITICAL VALUE ALERT - BLOOD CULTURE IDENTIFICATION (BCID)  [name] is an 31 y.o. female who presented to [facility] on 01/18/2022 with a chief complaint of flu like s/s HA, ear pain, sore throat, joint pain, fever reported 103.2, n/v  Assessment:  BC detected Streptococcus species after patient had been discharged from [facility].  Name of physician (or Provider) Contacted: Dr. Nelda Marseille and Dr Harolyn Rutherford were made aware of results during morning rounds.     Results for orders placed or performed during the hospital encounter of 01/18/22  Blood Culture ID Panel (Reflexed) (Collected: 01/18/2022  2:33 AM)  Result Value Ref Range   Enterococcus faecalis NOT DETECTED NOT DETECTED   Enterococcus Faecium NOT DETECTED NOT DETECTED   Listeria monocytogenes NOT DETECTED NOT DETECTED   Staphylococcus species NOT DETECTED NOT DETECTED   Staphylococcus aureus (BCID) NOT DETECTED NOT DETECTED   Staphylococcus epidermidis NOT DETECTED NOT DETECTED   Staphylococcus lugdunensis NOT DETECTED NOT DETECTED   Streptococcus species DETECTED (A) NOT DETECTED   Streptococcus agalactiae NOT DETECTED NOT DETECTED   Streptococcus pneumoniae NOT DETECTED NOT DETECTED   Streptococcus pyogenes NOT DETECTED NOT DETECTED   A.calcoaceticus-baumannii NOT DETECTED NOT DETECTED   Bacteroides fragilis NOT DETECTED NOT DETECTED   Enterobacterales NOT DETECTED NOT DETECTED   Enterobacter cloacae complex NOT DETECTED NOT DETECTED   Escherichia coli NOT DETECTED NOT DETECTED   Klebsiella aerogenes NOT DETECTED NOT DETECTED   Klebsiella oxytoca NOT DETECTED NOT DETECTED   Klebsiella pneumoniae NOT DETECTED NOT DETECTED   Proteus species NOT DETECTED NOT DETECTED   Salmonella species NOT DETECTED NOT DETECTED   Serratia marcescens NOT DETECTED NOT DETECTED   Haemophilus influenzae NOT DETECTED NOT DETECTED   Neisseria meningitidis NOT DETECTED NOT DETECTED   Pseudomonas aeruginosa NOT DETECTED  NOT DETECTED   Stenotrophomonas maltophilia NOT DETECTED NOT DETECTED   Candida albicans NOT DETECTED NOT DETECTED   Candida auris NOT DETECTED NOT DETECTED   Candida glabrata NOT DETECTED NOT DETECTED   Candida krusei NOT DETECTED NOT DETECTED   Candida parapsilosis NOT DETECTED NOT DETECTED   Candida tropicalis NOT DETECTED NOT DETECTED   Cryptococcus neoformans/gattii NOT DETECTED NOT DETECTED    [name], Cindy Treto 01/19/2022  2:03 PM

## 2022-01-21 LAB — CULTURE, BLOOD (ROUTINE X 2): Special Requests: ADEQUATE

## 2022-01-22 ENCOUNTER — Telehealth: Payer: Self-pay | Admitting: Family Medicine

## 2022-01-22 NOTE — Telephone Encounter (Signed)
Called patient due to [facility] micro report listing positive blood culture from her [facility] visit on 01/18/2022.   Discussed w Dr. Baxter Flattery via secure chat, appears to be contaminant.   Per her recs I called patient to see how she was doing, says overall she is moving in the right direction. I discussed the result and gave return precautions.   Clarnce Flock, MD/[facility] Attending Family Medicine Physician, [facility] for [facility], [place]

## 2022-01-23 LAB — CULTURE, BLOOD (ROUTINE X 2)
Culture: NO GROWTH
Special Requests: ADEQUATE
